# Patient Record
Sex: Male | Born: 1995 | Race: Black or African American | Hispanic: No | Marital: Single | Smoking: Never smoker
Health system: Southern US, Community
[De-identification: ages and names within clinical notes are randomized; demographics above are authoritative.]

---
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
## 2017-05-04 DIAGNOSIS — F329 Major depressive disorder, single episode, unspecified: Secondary | ICD-10-CM | POA: Diagnosis not present

## 2017-05-04 DIAGNOSIS — F411 Generalized anxiety disorder: Secondary | ICD-10-CM | POA: Diagnosis not present

## 2017-05-04 DIAGNOSIS — F451 Undifferentiated somatoform disorder: Secondary | ICD-10-CM | POA: Diagnosis not present

## 2017-05-11 DIAGNOSIS — S60221A Contusion of right hand, initial encounter: Secondary | ICD-10-CM | POA: Diagnosis not present

## 2017-05-11 DIAGNOSIS — Y9367 Activity, basketball: Secondary | ICD-10-CM | POA: Diagnosis not present

## 2017-05-27 DIAGNOSIS — F451 Undifferentiated somatoform disorder: Secondary | ICD-10-CM | POA: Diagnosis not present

## 2017-05-27 DIAGNOSIS — F411 Generalized anxiety disorder: Secondary | ICD-10-CM | POA: Diagnosis not present

## 2017-05-27 DIAGNOSIS — F329 Major depressive disorder, single episode, unspecified: Secondary | ICD-10-CM | POA: Diagnosis not present

## 2017-10-09 ENCOUNTER — Encounter: Payer: Self-pay | Admitting: Family Medicine

## 2017-10-09 ENCOUNTER — Ambulatory Visit: Payer: BLUE CROSS/BLUE SHIELD | Admitting: Family Medicine

## 2017-10-09 VITALS — BP 126/80 | HR 71 | Ht 72.0 in | Wt 310.0 lb

## 2017-10-09 DIAGNOSIS — Z6841 Body Mass Index (BMI) 40.0 and over, adult: Secondary | ICD-10-CM | POA: Insufficient documentation

## 2017-10-09 DIAGNOSIS — Z0000 Encounter for general adult medical examination without abnormal findings: Secondary | ICD-10-CM | POA: Insufficient documentation

## 2017-10-09 DIAGNOSIS — Z0001 Encounter for general adult medical examination with abnormal findings: Secondary | ICD-10-CM

## 2017-10-09 NOTE — Patient Instructions (Signed)
Exercising to Lose Weight Exercising can help you to lose weight. In order to lose weight through exercise, you need to do vigorous-intensity exercise. You can tell that you are exercising with vigorous intensity if you are breathing very hard and fast and cannot hold a conversation while exercising. Moderate-intensity exercise helps to maintain your current weight. You can tell that you are exercising at a moderate level if you have a higher heart rate and faster breathing, but you are still able to hold a conversation. How often should I exercise? Choose an activity that you enjoy and set realistic goals. Your health care provider can help you to make an activity plan that works for you. Exercise regularly as directed by your health care provider. This may include:  Doing resistance training twice each week, such as: ? Push-ups. ? Sit-ups. ? Lifting weights. ? Using resistance bands.  Doing a given intensity of exercise for a given amount of time. Choose from these options: ? 150 minutes of moderate-intensity exercise every week. ? 75 minutes of vigorous-intensity exercise every week. ? A mix of moderate-intensity and vigorous-intensity exercise every week.  Children, pregnant women, people who are out of shape, people who are overweight, and older adults may need to consult a health care provider for individual recommendations. If you have any sort of medical condition, be sure to consult your health care provider before starting a new exercise program. What are some activities that can help me to lose weight?  Walking at a rate of at least 4.5 miles an hour.  Jogging or running at a rate of 5 miles per hour.  Biking at a rate of at least 10 miles per hour.  Lap swimming.  Roller-skating or in-line skating.  Cross-country skiing.  Vigorous competitive sports, such as football, basketball, and soccer.  Jumping rope.  Aerobic dancing. How can I be more active in my day-to-day  activities?  Use the stairs instead of the elevator.  Take a walk during your lunch break.  If you drive, park your car farther away from work or school.  If you take public transportation, get off one stop early and walk the rest of the way.  Make all of your phone calls while standing up and walking around.  Get up, stretch, and walk around every 30 minutes throughout the day. What guidelines should I follow while exercising?  Do not exercise so much that you hurt yourself, feel dizzy, or get very short of breath.  Consult your health care provider prior to starting a new exercise program.  Wear comfortable clothes and shoes with good support.  Drink plenty of water while you exercise to prevent dehydration or heat stroke. Body water is lost during exercise and must be replaced.  Work out until you breathe faster and your heart beats faster. This information is not intended to replace advice given to you by your health care provider. Make sure you discuss any questions you have with your health care provider. Document Released: 03/01/2010 Document Revised: 07/05/2015 Document Reviewed: 06/30/2013 Elsevier Interactive Patient Education  2018 [place] Maintenance, Male A healthy lifestyle and preventive care is important for your health and wellness. Ask your health care provider about what schedule of regular examinations is right for you. What should I know about weight and diet? Eat a Healthy Diet  Eat plenty of vegetables, fruits, whole grains, low-fat dairy products, and lean protein.  Do not eat a lot of foods high in solid fats,  added sugars, or salt.  Maintain a Healthy Weight Regular exercise can help you achieve or maintain a healthy weight. You should:  Do at least 150 minutes of exercise each week. The exercise should increase your heart rate and make you sweat (moderate-intensity exercise).  Do strength-training exercises at least twice a  week.  Watch Your Levels of Cholesterol and Blood Lipids  Have your blood tested for lipids and cholesterol every 5 years starting at 21 years of age. If you are at high risk for heart disease, you should start having your blood tested when you are 20 years old. You may need to have your cholesterol levels checked more often if: ? Your lipid or cholesterol levels are high. ? You are older than 21 years of age. ? You are at high risk for heart disease.  What should I know about cancer screening? Many types of cancers can be detected early and may often be prevented. Lung Cancer  You should be screened every year for lung cancer if: ? You are a current smoker who has smoked for at least 30 years. ? You are a former smoker who has quit within the past 15 years.  Talk to your health care provider about your screening options, when you should start screening, and how often you should be screened.  Colorectal Cancer  Routine colorectal cancer screening usually begins at 21 years of age and should be repeated every 5-10 years until you are 21 years old. You may need to be screened more often if early forms of precancerous polyps or small growths are found. Your health care provider may recommend screening at an earlier age if you have risk factors for colon cancer.  Your health care provider may recommend using home test kits to check for hidden blood in the stool.  A small camera at the end of a tube can be used to examine your colon (sigmoidoscopy or colonoscopy). This checks for the earliest forms of colorectal cancer.  Prostate and Testicular Cancer  Depending on your age and overall health, your health care provider may do certain tests to screen for prostate and testicular cancer.  Talk to your health care provider about any symptoms or concerns you have about testicular or prostate cancer.  Skin Cancer  Check your skin from head to toe regularly.  Tell your health care provider  about any new moles or changes in moles, especially if: ? There is a change in a mole's size, shape, or color. ? You have a mole that is larger than a pencil eraser.  Always use sunscreen. Apply sunscreen liberally and repeat throughout the day.  Protect yourself by wearing long sleeves, pants, a wide-brimmed hat, and sunglasses when outside.  What should I know about heart disease, diabetes, and high blood pressure?  If you are 18-39 years of age, have your blood pressure checked every 3-5 years. If you are 40 years of age or older, have your blood pressure checked every year. You should have your blood pressure measured twice-once when you are at a hospital or clinic, and once when you are not at a hospital or clinic. Record the average of the two measurements. To check your blood pressure when you are not at a hospital or clinic, you can use: ? An automated blood pressure machine at a pharmacy. ? A home blood pressure monitor.  Talk to your health care provider about your target blood pressure.  If you are between 45-79 years old, ask   your health care provider if you should take aspirin to prevent heart disease.  Have regular diabetes screenings by checking your fasting blood sugar level. ? If you are at a normal weight and have a low risk for diabetes, have this test once every three years after the age of 76. ? If you are overweight and have a high risk for diabetes, consider being tested at a younger age or more often.  A one-time screening for abdominal aortic aneurysm (AAA) by ultrasound is recommended for men aged 65-75 years who are current or former smokers. What should I know about preventing infection? Hepatitis B If you have a higher risk for hepatitis B, you should be screened for this virus. Talk with your health care provider to find out if you are at risk for hepatitis B infection. Hepatitis C Blood testing is recommended for:  Everyone born from [birth year] through  1964.  Anyone with known risk factors for hepatitis C.  Sexually Transmitted Diseases (STDs)  You should be screened each year for STDs including gonorrhea and chlamydia if: ? You are sexually active and are younger than 21 years of age. ? You are older than 21 years of age and your health care provider tells you that you are at risk for this type of infection. ? Your sexual activity has changed since you were last screened and you are at an increased risk for chlamydia or gonorrhea. Ask your health care provider if you are at risk.  Talk with your health care provider about whether you are at high risk of being infected with HIV. Your health care provider may recommend a prescription medicine to help prevent HIV infection.  What else can I do?  Schedule regular health, dental, and eye exams.  Stay current with your vaccines (immunizations).  Do not use any tobacco products, such as cigarettes, chewing tobacco, and e-cigarettes. If you need help quitting, ask your health care provider.  Limit alcohol intake to no more than 2 drinks per day. One drink equals 12 ounces of beer, 5 ounces of wine, or 1 ounces of hard liquor.  Do not use street drugs.  Do not share needles.  Ask your health care provider for help if you need support or information about quitting drugs.  Tell your health care provider if you often feel depressed.  Tell your health care provider if you have ever been abused or do not feel safe at home. This information is not intended to replace advice given to you by your health care provider. Make sure you discuss any questions you have with your health care provider. Document Released: 07/26/2007 Document Revised: 09/26/2015 Document Reviewed: 10/31/2014 Elsevier Interactive Patient Education  2018 [employer].  How to Increase Your Level of Physical Activity Getting regular physical activity is important for your overall health and well-being. Most people do not  get enough exercise. There are easy ways to increase your level of physical activity, even if you have not been very active in the past or you are just starting out. Why is physical activity important? Physical activity has many short-term and long-term health benefits. Regular exercise can:  Help you lose weight or maintain a healthy weight.  Strengthen your muscles and bones.  Boost your mood and improve self-esteem.  Reduce your risk of certain long-term (chronic) diseases, like heart disease, cancer, and diabetes.  Help you stay capable of walking and moving around (mobile) as you age.  Prevent accidents, such as falls, as you  age.  Increase life expectancy.  What are the benefits of being physically active on a regular basis? In addition to improving your physical health, being physically active on most days of the week can help you in ways that you may not expect. Benefits of regular physical activity may include:  Feeling good about your body.  Being able to move around more easily and for longer periods of time without getting tired (increased stamina).  Finding new sources of fun and enjoyment.  Meeting new people who share a common interest.  Being able to fight off illness better (enhanced immunity).  Being able to sleep better.  What can happen if I am not physically active on a regular basis? Not getting enough physical activity can lead to an unhealthy lifestyle and future health problems. This can increase your chances of:  Becoming overweight or obese.  Becoming sick.  Developing chronic illnesses, like heart disease or diabetes.  Having mental health problems, like depression or anxiety.  Having sleep problems.  Having trouble walking or getting yourself around (reduced mobility).  Injuring yourself in a fall as you get older.  What steps can I take to be more physically active?  Check with your health care provider about how to get started. Ask  your health care provider what activities are safe for you.  Start out slowly. Walking or doing some simple chair exercises is a good place to start, especially if you have not been active before or for a long time.  Try to find activities that you enjoy. You are more likely to commit to an exercise routine if it does not feel like a chore.  If you have bone or joint problems, choose low-impact exercises, like walking or swimming.  Include physical activity in your everyday routine.  Invite friends or family members to exercise with you. This also will help you commit to your workout plan.  Set goals that you can work toward.  Aim for at least 150 minutes of moderate-intensity exercise each week. Examples of moderate-intensity exercise include walking or riding a bike. Where to find more information:  Centers for Disease Control and Prevention: JokeRule.co.ukwww.cdc.gov/physicalactivity/index.html  President's [employer] on [employer] www.http://smith-thompson.com/fitness.gov/resource-center  ChooseMyPlate: MissedFlights.com.brwww.choosemyplate.gov/physical-activity Contact a health care provider if:  You have headaches, muscle aches, or joint pain.  You feel dizzy or light-headed while exercising.  You faint.  You have chest pain while exercising. Summary  Exercise benefits your mind and body at any age, even if you are just starting out.  If you have a chronic illness or have not been active for a while, check with your health care provider before increasing your physical activity.  Choose activities that are safe and enjoyable for you.Ask your health care provider what activities are safe for you.  Start slowly. Tell your health care provider if you have problems as you start to increase your activity level. This information is not intended to replace advice given to you by your health care provider. Make sure you discuss any questions you have with your health care provider. Document Released: 01/17/2016 Document  Revised: 01/17/2016 Document Reviewed: 01/17/2016 Elsevier Interactive Patient Education  [employer]2018 Elsevier Inc.

## 2017-10-09 NOTE — Progress Notes (Signed)
Subjective:  Patient ID: Anthony Parker, male    DOB: Apr 27, 1995  Age: 22 y.o. MRN: [id number]  CC: Establish Care   HPI Miraj ROBERTS BON presents for evaluation of skin tone changes below his right eye.  He is currently at [employer] under sophomore status taking science courses.  He is not certain what he wants to do with his degree when he finishes.  He currently is not motivated to start using the gym at school.  He is entirely aware that his current weight is associated with excess consumption of calories.  He is interested in exploring some of the options that may be available for help with his weight.  No outpatient medications prior to visit.   No facility-administered medications prior to visit.     ROS Review of Systems  Constitutional: Negative for chills, diaphoresis, fatigue, fever and unexpected weight change.  HENT: Negative.   Eyes: Negative.   Respiratory: Negative.   Cardiovascular: Negative.   Gastrointestinal: Negative.   Endocrine: Negative for polyphagia and polyuria.  Genitourinary: Negative.   Skin: Positive for color change. Negative for pallor and rash.  Allergic/Immunologic: Negative for immunocompromised state.  Neurological: Negative.   Hematological: Does not bruise/bleed easily.  Psychiatric/Behavioral: Negative.     Objective:  BP 126/80   Pulse 71   Ht 6' (1.829 m)   Wt (!) 310 lb (140.6 kg)   SpO2 98%   BMI 42.04 kg/m   BP Readings from Last 3 Encounters:  10/09/17 126/80    Wt Readings from Last 3 Encounters:  10/09/17 (!) 310 lb (140.6 kg)    Physical Exam  Constitutional: He is oriented to person, place, and time. He appears well-developed and well-nourished. No distress.  HENT:  Head: Normocephalic and atraumatic.  Right Ear: External ear normal.  Left Ear: External ear normal.  Nose: Nose normal.  Mouth/Throat: Oropharynx is clear and moist. No oropharyngeal exudate.  Eyes: Pupils are equal, round, and reactive to light.  Conjunctivae and EOM are normal. Right eye exhibits no discharge. Left eye exhibits no discharge. No scleral icterus.  Neck: Normal range of motion. Neck supple. No JVD present. No tracheal deviation present. No thyromegaly present.  Cardiovascular: Normal rate, regular rhythm and normal heart sounds.  Pulmonary/Chest: Effort normal and breath sounds normal.  Abdominal: Soft. Bowel sounds are normal. He exhibits no distension. There is no tenderness. There is no guarding. Hernia confirmed negative in the right inguinal area and confirmed negative in the left inguinal area.  Genitourinary: Testes normal and penis normal. Right testis shows no mass, no swelling and no tenderness. Right testis is descended. Left testis shows no mass, no swelling and no tenderness. Left testis is descended. Circumcised. No hypospadias, penile erythema or penile tenderness. No discharge found.  Musculoskeletal: Normal range of motion. He exhibits no edema, tenderness or deformity.  Lymphadenopathy:    He has no cervical adenopathy. No inguinal adenopathy noted on the right or left side.  Neurological: He is alert and oriented to person, place, and time.  Skin: Skin is warm and dry. No rash noted. He is not diaphoretic. No erythema. No pallor.       No results found for: WBC, HGB, HCT, PLT, GLUCOSE, CHOL, TRIG, HDL, LDLDIRECT, LDLCALC, ALT, AST, NA, K, CL, CREATININE, BUN, CO2, TSH, PSA, INR, GLUF, HGBA1C, MICROALBUR  US Scrotum  Result Date: 03/07/2015 CLINICAL DATA:  Left-sided scrotal pain and swelling for 1 week EXAM: SCROTAL ULTRASOUND DOPPLER ULTRASOUND OF THE TESTICLES TECHNIQUE:  Complete ultrasound examination of the testicles, epididymis, and other scrotal structures was performed. Color and spectral Doppler ultrasound were also utilized to evaluate blood flow to the testicles. COMPARISON:  None. FINDINGS: Right testicle Measurements: 3.6 x 1.8 x 2.3 cm. No mass or microlithiasis visualized. Left testicle  Measurements: 3.0 x 1.4 x 1.9 cm. No mass or microlithiasis visualized. Right epididymis:  Normal in size and appearance. Left epididymis: The left epididymis appears inhomogeneous in echotexture and mildly hyperemic. There is no well-defined mass in the left epididymis. Hydrocele:  There are minimal hydroceles bilaterally. Varicocele:  None visualized. Pulsed Doppler interrogation of both testes demonstrates normal low resistance arterial and venous waveforms bilaterally. The peak systolic velocity in the left testis is approximately 5 cm/sec. The peak systolic velocity in the right testis is approximately 5 cm/sec. There is no scrotal abscess or scrotal wall thickening on either side. IMPRESSION: No intra testicular mass or torsion on either side. Evidence of left epididymitis. No extratesticular mass on either side. Rather minimal hydroceles bilaterally. Electronically Signed   By: Bretta BangWilliam  Woodruff III M.D.   On: 03/07/2015 15:59   [place] Art/ven Flow Abd Pelv Doppler  Result Date: 03/07/2015 CLINICAL DATA:  Left-sided scrotal pain and swelling for 1 week EXAM: SCROTAL ULTRASOUND DOPPLER ULTRASOUND OF THE TESTICLES TECHNIQUE: Complete ultrasound examination of the testicles, epididymis, and other scrotal structures was performed. Color and spectral Doppler ultrasound were also utilized to evaluate blood flow to the testicles. COMPARISON:  None. FINDINGS: Right testicle Measurements: 3.6 x 1.8 x 2.3 cm. No mass or microlithiasis visualized. Left testicle Measurements: 3.0 x 1.4 x 1.9 cm. No mass or microlithiasis visualized. Right epididymis:  Normal in size and appearance. Left epididymis: The left epididymis appears inhomogeneous in echotexture and mildly hyperemic. There is no well-defined mass in the left epididymis. Hydrocele:  There are minimal hydroceles bilaterally. Varicocele:  None visualized. Pulsed Doppler interrogation of both testes demonstrates normal low resistance arterial and venous waveforms  bilaterally. The peak systolic velocity in the left testis is approximately 5 cm/sec. The peak systolic velocity in the right testis is approximately 5 cm/sec. There is no scrotal abscess or scrotal wall thickening on either side. IMPRESSION: No intra testicular mass or torsion on either side. Evidence of left epididymitis. No extratesticular mass on either side. Rather minimal hydroceles bilaterally. Electronically Signed   By: Bretta BangWilliam  Woodruff III M.D.   On: 03/07/2015 15:59    Assessment & Plan:   Corbin was seen today for establish care.  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 not currently have medications on file.  No orders of the defined types were placed in this encounter.    Follow-up: Return in about 6 months (around 04/10/2018).  Mliss SaxWilliam Alfred Nuvia Hileman, MD

## 2017-10-16 DIAGNOSIS — F329 Major depressive disorder, single episode, unspecified: Secondary | ICD-10-CM | POA: Diagnosis not present

## 2017-10-16 DIAGNOSIS — F451 Undifferentiated somatoform disorder: Secondary | ICD-10-CM | POA: Diagnosis not present

## 2017-10-16 DIAGNOSIS — F411 Generalized anxiety disorder: Secondary | ICD-10-CM | POA: Diagnosis not present

## 2018-04-16 DIAGNOSIS — F451 Undifferentiated somatoform disorder: Secondary | ICD-10-CM | POA: Diagnosis not present

## 2018-04-16 DIAGNOSIS — F411 Generalized anxiety disorder: Secondary | ICD-10-CM | POA: Diagnosis not present

## 2018-04-16 DIAGNOSIS — F329 Major depressive disorder, single episode, unspecified: Secondary | ICD-10-CM | POA: Diagnosis not present

## 2018-05-19 DIAGNOSIS — R590 Localized enlarged lymph nodes: Secondary | ICD-10-CM | POA: Diagnosis not present

## 2018-05-28 ENCOUNTER — Ambulatory Visit: Payer: BLUE CROSS/BLUE SHIELD | Admitting: Family Medicine

## 2018-05-31 ENCOUNTER — Ambulatory Visit (INDEPENDENT_AMBULATORY_CARE_PROVIDER_SITE_OTHER): Payer: BLUE CROSS/BLUE SHIELD | Admitting: Family Medicine

## 2018-05-31 ENCOUNTER — Encounter: Payer: Self-pay | Admitting: Family Medicine

## 2018-05-31 VITALS — Ht 72.0 in

## 2018-05-31 DIAGNOSIS — R0981 Nasal congestion: Secondary | ICD-10-CM

## 2018-05-31 DIAGNOSIS — R221 Localized swelling, mass and lump, neck: Secondary | ICD-10-CM

## 2018-05-31 DIAGNOSIS — Z0001 Encounter for general adult medical examination with abnormal findings: Secondary | ICD-10-CM

## 2018-05-31 DIAGNOSIS — R590 Localized enlarged lymph nodes: Secondary | ICD-10-CM | POA: Diagnosis not present

## 2018-05-31 MED ORDER — FLUTICASONE PROPIONATE 50 MCG/ACT NA SUSP: 2.0000 | Freq: Every day | NASAL | 6 refills | Status: DC

## 2018-05-31 NOTE — Progress Notes (Signed)
Established Patient Office Visit  Subjective:  Patient ID: Anthony Parker, male    DOB: 11/28/1995  Age: 23 y.o. MRN: [id number]  CC:  Chief Complaint  Patient presents with  . Adenopathy    HPI Anthony Parker presents for evaluation of a longstanding history neck masses that have been described as enlarged lymph nodes.  There is a large 1 in the left anterior neck down at the base of the neck.  Patient says that this is been present for over a year now.  More recently he has had the development of smaller masses also on the left side of his neck.  Patient denies fevers chills, night sweats or weight loss.  He has had some problems with his teeth including a dental carry that is new.  Denies swelling or pain in his teeth.  Teeth that have previously been filled have been a little cold sensitive.  Denies postnasal drip facial pressure rhinorrhea but does admit to chronic nasal congestion.  He says that he uses a nasal decongestants perhaps 4 times a month.  He was seen at urgent care last week for this and referred back to me.  A CBC was drawn at that time and he was told that his white blood cell count was low.  History reviewed. No pertinent past medical history.  History reviewed. No pertinent surgical history.  History reviewed. No pertinent family history.  Social History   Socioeconomic History  . Marital status: Single    Spouse name: Not on file  . Number of children: Not on file  . Years of education: Not on file  . Highest education level: Not on file  Occupational History  . Not on file  Social Needs  . Financial resource strain: Not on file  . Food insecurity:    Worry: Not on file    Inability: Not on file  . Transportation needs:    Medical: Not on file    Non-medical: Not on file  Tobacco Use  . Smoking status: Never Smoker  . Smokeless tobacco: Never Used  Substance and Sexual Activity  . Alcohol use: Not on file  . Drug use: Not on file  . Sexual  activity: Not on file  Lifestyle  . Physical activity:    Days per week: Not on file    Minutes per session: Not on file  . Stress: Not on file  Relationships  . Social connections:    Talks on phone: Not on file    Gets together: Not on file    Attends religious service: Not on file    Active member of club or organization: Not on file    Attends meetings of clubs or organizations: Not on file    Relationship status: Not on file  . Intimate partner violence:    Fear of current or ex partner: Not on file    Emotionally abused: Not on file    Physically abused: Not on file    Forced sexual activity: Not on file  Other Topics Concern  . Not on file  Social History Narrative  . Not on file    Outpatient Medications Prior to Visit  Medication Sig Dispense Refill  . sertraline (ZOLOFT) 50 MG tablet Take 1 tablet by mouth daily.    Marland Kitchen topiramate (TOPAMAX) 25 MG tablet      No facility-administered medications prior to visit.     Not on File  ROS Review of Systems  Constitutional: Negative  for activity change, appetite change, chills, diaphoresis, fatigue, fever and unexpected weight change.  HENT: Positive for congestion, dental problem and sore throat. Negative for hearing loss, mouth sores, nosebleeds, postnasal drip, rhinorrhea, sinus pain, trouble swallowing and voice change.   Respiratory: Negative.   Cardiovascular: Negative.   Gastrointestinal: Negative.   Genitourinary: Negative.   Musculoskeletal: Negative for arthralgias, myalgias, neck pain and neck stiffness.  Skin: Negative for pallor and rash.  Allergic/Immunologic: Negative for immunocompromised state.  Neurological: Negative for speech difficulty, weakness and headaches.  Hematological: Does not bruise/bleed easily.      Objective:    Physical Exam  Constitutional: He is oriented to person, place, and time. He appears well-developed and well-nourished. No distress.  HENT:  Head: Normocephalic and  atraumatic.  Right Ear: External ear normal.  Left Ear: External ear normal.  Eyes: Right eye exhibits no discharge. Left eye exhibits no discharge. No scleral icterus.  Pulmonary/Chest: Effort normal.  Neurological: He is alert and oriented to person, place, and time.  Skin: He is not diaphoretic.  Psychiatric: He has a normal mood and affect. His behavior is normal.    Ht 6' (1.829 m)   BMI 42.04 kg/m  Wt Readings from Last 3 Encounters:  10/09/17 (!) 310 lb (140.6 kg)     Health Maintenance Due  Topic Date Due  . HIV Screening  08/05/2011  . TETANUS/TDAP  08/05/2015    There are no preventive care reminders to display for this patient.  No results found for: TSH No results found for: WBC, HGB, HCT, MCV, PLT No results found for: NA, K, CHLORIDE, CO2, GLUCOSE, BUN, CREATININE, BILITOT, ALKPHOS, AST, ALT, PROT, ALBUMIN, CALCIUM, ANIONGAP, EGFR, GFR No results found for: CHOL No results found for: HDL No results found for: LDLCALC No results found for: TRIG No results found for: CHOLHDL No results found for: HGBA1C    Assessment & Plan:   Problem List Items Addressed This Visit      Immune and Lymphatic   Cervical adenopathy   Relevant Orders   CBC   HIV Antibody (routine testing w rflx)   RPR   Sedimentation rate     Other   Encounter for health maintenance examination with abnormal findings   Relevant Orders   CBC   Comprehensive metabolic panel   Lipid panel   TSH   Urinalysis, Routine w reflex microscopic   Neck mass - Primary   Relevant Orders   CBC   TSH   Sedimentation rate   Nasal congestion   Relevant Medications   fluticasone (FLONASE) 50 MCG/ACT nasal spray      Meds ordered this encounter  Medications  . fluticasone (FLONASE) 50 MCG/ACT nasal spray    Sig: Place 2 sprays into both nostrils daily.    Dispense:  16 g    Refill:  6    Follow-up: No follow-ups on file.    Libby Maw, MDVirtual Visit via Video Note  I  connected with Anthony Parker on 05/31/18 at  3:30 PM EDT by a video enabled telemedicine application and verified that I am speaking with the correct person using two identifiers.   I discussed the limitations of evaluation and management by telemedicine and the availability of in person appointments. The patient expressed understanding and agreed to proceed.  History of Present Illness:    Observations/Objective:   Assessment and Plan:   Follow Up Instructions:    I discussed the assessment and treatment plan with  the patient. The patient was provided an opportunity to ask questions and all were answered. The patient agreed with the plan and demonstrated an understanding of the instructions.   The patient was advised to call back or seek an in-person evaluation if the symptoms worsen or if the condition fails to improve as anticipated.  I provided 30 minutes of non-face-to-face time during this encounter.  Patient will schedule a lab visit and have those labs drawn.  He will then be scheduled to see me in person for evaluation of his neck masses.  Patient was asked to discontinue the nasal decongestant and start Flonase.  We will check his progress when I see him hopefully in the next few weeks.

## 2018-06-02 ENCOUNTER — Other Ambulatory Visit (INDEPENDENT_AMBULATORY_CARE_PROVIDER_SITE_OTHER): Payer: BLUE CROSS/BLUE SHIELD

## 2018-06-02 DIAGNOSIS — R590 Localized enlarged lymph nodes: Secondary | ICD-10-CM

## 2018-06-02 DIAGNOSIS — Z0001 Encounter for general adult medical examination with abnormal findings: Secondary | ICD-10-CM | POA: Diagnosis not present

## 2018-06-02 DIAGNOSIS — R221 Localized swelling, mass and lump, neck: Secondary | ICD-10-CM

## 2018-06-02 LAB — COMPREHENSIVE METABOLIC PANEL
ALT: 26 U/L (ref 0–53)
AST: 21 U/L (ref 0–37)
Albumin: 3.9 g/dL (ref 3.5–5.2)
Alkaline Phosphatase: 95 U/L (ref 39–117)
BUN: 14 mg/dL (ref 6–23)
CO2: 26 mEq/L (ref 19–32)
Calcium: 9.5 mg/dL (ref 8.4–10.5)
Chloride: 103 mEq/L (ref 96–112)
Creatinine, Ser: 0.8 mg/dL (ref 0.40–1.50)
GFR: 146.5 mL/min (ref >60.00)
Glucose, Bld: 136 mg/dL — ABNORMAL HIGH (ref 70–99)
Potassium: 4.4 mEq/L (ref 3.5–5.1)
Sodium: 138 mEq/L (ref 135–145)
Total Bilirubin: 0.4 mg/dL (ref 0.2–1.2)
Total Protein: 7.6 g/dL (ref 6.0–8.3)

## 2018-06-02 LAB — LIPID PANEL
Cholesterol: 189 mg/dL (ref 0–200)
HDL: 46.9 mg/dL (ref >39.00)
LDL Cholesterol: 129 mg/dL — ABNORMAL HIGH (ref 0–99)
NonHDL: 141.6
Total CHOL/HDL Ratio: 4
Triglycerides: 62 mg/dL (ref 0.0–149.0)
VLDL: 12.4 mg/dL (ref 0.0–40.0)

## 2018-06-02 LAB — URINALYSIS, ROUTINE W REFLEX MICROSCOPIC
Bilirubin Urine: NEGATIVE
Hgb urine dipstick: NEGATIVE
Ketones, ur: NEGATIVE
Leukocytes,Ua: NEGATIVE
Nitrite: NEGATIVE
RBC / HPF: NONE SEEN (ref 0–?)
Specific Gravity, Urine: >=1.03 — AB (ref 1.000–1.030)
Total Protein, Urine: NEGATIVE
Urine Glucose: NEGATIVE
Urobilinogen, UA: 0.2 (ref 0.0–1.0)
pH: 5.5 (ref 5.0–8.0)

## 2018-06-02 LAB — CBC
HCT: 40 % (ref 39.0–52.0)
Hemoglobin: 13.2 g/dL (ref 13.0–17.0)
MCHC: 32.9 g/dL (ref 30.0–36.0)
MCV: 85.4 fl (ref 78.0–100.0)
Platelets: 265 10*3/uL (ref 150.0–400.0)
RBC: 4.68 Mil/uL (ref 4.22–5.81)
RDW: 12.5 % (ref 11.5–15.5)
WBC: 7.4 10*3/uL (ref 4.0–10.5)

## 2018-06-02 LAB — SEDIMENTATION RATE: Sed Rate: 77 mm/hr — ABNORMAL HIGH (ref 0–15)

## 2018-06-02 LAB — TSH: TSH: 5.21 u[IU]/mL — ABNORMAL HIGH (ref 0.35–4.50)

## 2018-06-03 LAB — HIV ANTIBODY (ROUTINE TESTING W REFLEX): HIV 1&2 Ab, 4th Generation: NONREACTIVE

## 2018-06-03 LAB — RPR: RPR Ser Ql: NONREACTIVE

## 2018-06-08 ENCOUNTER — Encounter: Payer: Self-pay | Admitting: Family Medicine

## 2018-06-08 ENCOUNTER — Ambulatory Visit (INDEPENDENT_AMBULATORY_CARE_PROVIDER_SITE_OTHER): Payer: BLUE CROSS/BLUE SHIELD

## 2018-06-08 ENCOUNTER — Other Ambulatory Visit: Payer: Self-pay

## 2018-06-08 ENCOUNTER — Ambulatory Visit: Payer: BLUE CROSS/BLUE SHIELD | Admitting: Family Medicine

## 2018-06-08 VITALS — BP 124/80 | HR 97 | Temp 98.0°F | Ht 72.0 in | Wt 384.0 lb

## 2018-06-08 DIAGNOSIS — R079 Chest pain, unspecified: Secondary | ICD-10-CM

## 2018-06-08 DIAGNOSIS — R221 Localized swelling, mass and lump, neck: Secondary | ICD-10-CM

## 2018-06-08 DIAGNOSIS — R0982 Postnasal drip: Secondary | ICD-10-CM | POA: Diagnosis not present

## 2018-06-08 DIAGNOSIS — R590 Localized enlarged lymph nodes: Secondary | ICD-10-CM

## 2018-06-08 DIAGNOSIS — R7989 Other specified abnormal findings of blood chemistry: Secondary | ICD-10-CM | POA: Diagnosis not present

## 2018-06-08 DIAGNOSIS — R700 Elevated erythrocyte sedimentation rate: Secondary | ICD-10-CM | POA: Diagnosis not present

## 2018-06-08 DIAGNOSIS — R7309 Other abnormal glucose: Secondary | ICD-10-CM | POA: Diagnosis not present

## 2018-06-08 DIAGNOSIS — R0602 Shortness of breath: Secondary | ICD-10-CM | POA: Diagnosis not present

## 2018-06-08 LAB — CBC
HCT: 41.5 % (ref 39.0–52.0)
Hemoglobin: 13.8 g/dL (ref 13.0–17.0)
MCHC: 33.3 g/dL (ref 30.0–36.0)
MCV: 85.7 fl (ref 78.0–100.0)
Platelets: 277 10*3/uL (ref 150.0–400.0)
RBC: 4.85 Mil/uL (ref 4.22–5.81)
RDW: 12.8 % (ref 11.5–15.5)
WBC: 8.2 10*3/uL (ref 4.0–10.5)

## 2018-06-08 LAB — HEMOGLOBIN A1C: Hgb A1c MFr Bld: 5.5 % (ref 4.6–6.5)

## 2018-06-08 LAB — SEDIMENTATION RATE: Sed Rate: 90 mm/hr — ABNORMAL HIGH (ref 0–15)

## 2018-06-08 LAB — C-REACTIVE PROTEIN: CRP: 2.4 mg/dL (ref 0.5–20.0)

## 2018-06-08 NOTE — Progress Notes (Signed)
Established Patient Office Visit  Subjective:  Patient ID: Anthony Parker, male    DOB: 07/17/1995  Age: 23 y.o. MRN: [id number]  CC:  Chief Complaint  Patient presents with  . Adenopathy    HPI [name] presents for follow-up of his bilateral neck masses.  Recent blood work did show a normal CBC.  However there was elevation of the TSH and his sedimentation rate.  Patient denies palpitations, weight loss or night sweats.  He feels relatively healthy.  He has been experiencing some mild chest pain.  Denies exertional component shortness of breath dyspnea on exertion nausea or vomiting or diaphoresis.  His father is currently on life support and there is plan to discontinue that today.  It is believed he had suffered a massive heart attack and has not been able to recover.  Patient has a history of anxiety depression.  He is currently out of work due to [employer].  History reviewed. No pertinent past medical history.  History reviewed. No pertinent surgical history.  History reviewed. No pertinent family history.  Social History   Socioeconomic History  . Marital status: Single    Spouse name: Not on file  . Number of children: Not on file  . Years of education: Not on file  . Highest education level: Not on file  Occupational History  . Not on file  Social Needs  . Financial resource strain: Not on file  . Food insecurity:    Worry: Not on file    Inability: Not on file  . Transportation needs:    Medical: Not on file    Non-medical: Not on file  Tobacco Use  . Smoking status: Never Smoker  . Smokeless tobacco: Never Used  Substance and Sexual Activity  . Alcohol use: Not on file  . Drug use: Not on file  . Sexual activity: Not on file  Lifestyle  . Physical activity:    Days per week: Not on file    Minutes per session: Not on file  . Stress: Not on file  Relationships  . Social connections:    Talks on phone: Not on file    Gets together: Not on file   Attends religious service: Not on file    Active member of club or organization: Not on file    Attends meetings of clubs or organizations: Not on file    Relationship status: Not on file  . Intimate partner violence:    Fear of current or ex partner: Not on file    Emotionally abused: Not on file    Physically abused: Not on file    Forced sexual activity: Not on file  Other Topics Concern  . Not on file  Social History Narrative  . Not on file    Outpatient Medications Prior to Visit  Medication Sig Dispense Refill  . fluticasone (FLONASE) 50 MCG/ACT nasal spray Place 2 sprays into both nostrils daily. 16 g 6  . sertraline (ZOLOFT) 50 MG tablet Take 1 tablet by mouth daily.    Marland Kitchen topiramate (TOPAMAX) 25 MG tablet      No facility-administered medications prior to visit.     Not on File  ROS Review of Systems  Constitutional: Negative for chills, diaphoresis, fatigue, fever and unexpected weight change.  HENT: Positive for postnasal drip and sneezing. Negative for dental problem, rhinorrhea, sinus pressure, sinus pain, sore throat, trouble swallowing and voice change.   Eyes: Negative for photophobia and visual disturbance.  Respiratory: Negative for chest tightness, shortness of breath and wheezing.   Cardiovascular: Positive for chest pain. Negative for palpitations and leg swelling.  Gastrointestinal: Negative.   Endocrine: Negative for polyphagia and polyuria.  Genitourinary: Negative.   Musculoskeletal: Negative for gait problem and joint swelling.  Skin: Negative for pallor and rash.  Allergic/Immunologic: Negative for immunocompromised state.  Neurological: Negative for light-headedness, numbness and headaches.  Hematological: Positive for adenopathy. Does not bruise/bleed easily.  Psychiatric/Behavioral: The patient is nervous/anxious.       Objective:    Physical Exam  Constitutional: He is oriented to person, place, and time. He appears well-developed and  well-nourished. No distress.  HENT:  Head: Normocephalic and atraumatic.  Right Ear: External ear normal.  Left Ear: External ear normal.  Mouth/Throat: Oropharynx is clear and moist. No oropharyngeal exudate.  Eyes: Pupils are equal, round, and reactive to light. Conjunctivae are normal. Right eye exhibits no discharge. Left eye exhibits no discharge. No scleral icterus.  Neck: Neck supple. No JVD present. No tracheal deviation present.  Cardiovascular: Normal rate, regular rhythm and normal heart sounds.  Pulmonary/Chest: Effort normal and breath sounds normal. No stridor.  Abdominal: Soft. Bowel sounds are normal. He exhibits no distension. There is no abdominal tenderness. There is no rebound and no guarding. Hernia confirmed negative in the right inguinal area and confirmed negative in the left inguinal area.  Genitourinary: Right testis shows no mass, no swelling and no tenderness. Right testis is descended. Cremasteric reflex is not absent on the right side. Left testis shows no swelling and no tenderness. Left testis is descended. Cremasteric reflex is not absent on the left side. Circumcised. No hypospadias, penile erythema or penile tenderness. No discharge found.  Lymphadenopathy:    He has cervical adenopathy.       Right cervical: Posterior cervical adenopathy present.       Left cervical: Posterior cervical adenopathy present.       Right axillary: No pectoral and no lateral adenopathy present.       Left axillary: No pectoral and no lateral adenopathy present.      Right: No inguinal adenopathy present.       Left: No inguinal adenopathy present.  1 cm nodes noted posterior cervical area.   Neurological: He is alert and oriented to person, place, and time.  Skin: Skin is warm and dry. He is not diaphoretic.  Psychiatric: He has a normal mood and affect. His behavior is normal.    BP 124/80   Pulse 97   Temp 98 F (36.7 C) (Oral)   Ht 6' (1.829 m)   Wt (!) 384 lb (174.2  kg)   SpO2 99%   BMI 52.08 kg/m  Wt Readings from Last 3 Encounters:  06/08/18 (!) 384 lb (174.2 kg)  10/09/17 (!) 310 lb (140.6 kg)   BP Readings from Last 3 Encounters:  06/08/18 124/80  10/09/17 126/80   Guideline developer:  UpToDate (see UpToDate for funding source) Date Released: June 2014  Health Maintenance Due  Topic Date Due  . Janet Berlin  08/05/2015    There are no preventive care reminders to display for this patient.  Lab Results  Component Value Date   TSH 5.21 (H) 06/02/2018   Lab Results  Component Value Date   WBC 7.4 06/02/2018   HGB 13.2 06/02/2018   HCT 40.0 06/02/2018   MCV 85.4 06/02/2018   PLT 265.0 06/02/2018   Lab Results  Component Value Date   NA 138  06/02/2018   K 4.4 06/02/2018   CO2 26 06/02/2018   GLUCOSE 136 (H) 06/02/2018   BUN 14 06/02/2018   CREATININE 0.80 06/02/2018   BILITOT 0.4 06/02/2018   ALKPHOS 95 06/02/2018   AST 21 06/02/2018   ALT 26 06/02/2018   PROT 7.6 06/02/2018   ALBUMIN 3.9 06/02/2018   CALCIUM 9.5 06/02/2018   GFR 146.50 06/02/2018   Lab Results  Component Value Date   CHOL 189 06/02/2018   Lab Results  Component Value Date   HDL 46.90 06/02/2018   Lab Results  Component Value Date   LDLCALC 129 (H) 06/02/2018   Lab Results  Component Value Date   TRIG 62.0 06/02/2018   Lab Results  Component Value Date   CHOLHDL 4 06/02/2018   No results found for: HGBA1C    Assessment & Plan:   Problem List Items Addressed This Visit      Immune and Lymphatic   Cervical adenopathy - Primary   Relevant Orders   CBC   US Soft Tissue Head/Neck     Other   Neck mass   Post-nasal drip   Elevated TSH   Chest pain   Relevant Orders   Thyroid Panel With TSH   DG Chest 2 View   Elevated sed rate   Relevant Orders   C-reactive protein   Sedimentation rate   Elevated glucose   Relevant Orders   Hemoglobin A1c      No orders of the defined types were placed in this encounter.    Follow-up: Return in about 1 month (around 07/08/2018).   Return in 1 month.  Discussed the possibility of a possible lymph node biopsy pending results of today's work-up.  Follow-up in 1 month.

## 2018-06-08 NOTE — Addendum Note (Signed)
Addended by: Andrez Grime on: 06/08/2018 03:04 PM   Modules accepted: Orders

## 2018-06-09 LAB — THYROID PANEL WITH TSH
Free Thyroxine Index: 2.7 (ref 1.4–3.8)
T3 Uptake: 31 % (ref 22–35)
T4, Total: 8.6 ug/dL (ref 4.9–10.5)
TSH: 4.27 mIU/L (ref 0.40–4.50)

## 2018-06-09 LAB — ANA: Anti Nuclear Antibody (ANA): NEGATIVE

## 2018-06-15 ENCOUNTER — Encounter: Payer: Self-pay | Admitting: Family Medicine

## 2018-06-24 ENCOUNTER — Encounter: Payer: Self-pay | Admitting: Family Medicine

## 2018-06-24 ENCOUNTER — Ambulatory Visit (INDEPENDENT_AMBULATORY_CARE_PROVIDER_SITE_OTHER): Payer: BLUE CROSS/BLUE SHIELD | Admitting: Family Medicine

## 2018-06-24 VITALS — Ht 72.0 in

## 2018-06-24 DIAGNOSIS — R700 Elevated erythrocyte sedimentation rate: Secondary | ICD-10-CM

## 2018-06-24 DIAGNOSIS — R635 Abnormal weight gain: Secondary | ICD-10-CM | POA: Diagnosis not present

## 2018-06-24 DIAGNOSIS — R1030 Lower abdominal pain, unspecified: Secondary | ICD-10-CM | POA: Diagnosis not present

## 2018-06-24 DIAGNOSIS — R590 Localized enlarged lymph nodes: Secondary | ICD-10-CM | POA: Diagnosis not present

## 2018-06-24 MED ORDER — DEXAMETHASONE 1 MG PO TABS: ORAL_TABLET | ORAL | 0 refills | Status: DC

## 2018-06-24 NOTE — Progress Notes (Addendum)
Established Patient Office Visit  Subjective:  Patient ID: Anthony Parker, male    DOB: 22-Dec-1995  Age: 23 y.o. MRN: [id number]  CC:  Chief Complaint  Patient presents with  . Follow-up    lab results    HPI Anthony Parker presents for follow-up of his elevating ESR with firm, fixed and gradually enlarging cervical lymphadenopathy.  Patient has been suffering from fatigue but has been gaining weight(74 lbs since August).  Denies night sweats myalgias or arthralgias.  Testing for HIV thyroid and CBC ANA  CRP   and hemoglobin A1c have all been normal.  See also review of symptoms documented today. History reviewed. No pertinent past medical history.  History reviewed. No pertinent surgical history.  History reviewed. No pertinent family history.  Social History   Socioeconomic History  . Marital status: Single    Spouse name: Not on file  . Number of children: Not on file  . Years of education: Not on file  . Highest education level: Not on file  Occupational History  . Not on file  Social Needs  . Financial resource strain: Not on file  . Food insecurity:    Worry: Not on file    Inability: Not on file  . Transportation needs:    Medical: Not on file    Non-medical: Not on file  Tobacco Use  . Smoking status: Never Smoker  . Smokeless tobacco: Never Used  Substance and Sexual Activity  . Alcohol use: Not on file  . Drug use: Not on file  . Sexual activity: Not on file  Lifestyle  . Physical activity:    Days per week: Not on file    Minutes per session: Not on file  . Stress: Not on file  Relationships  . Social connections:    Talks on phone: Not on file    Gets together: Not on file    Attends religious service: Not on file    Active member of club or organization: Not on file    Attends meetings of clubs or organizations: Not on file    Relationship status: Not on file  . Intimate partner violence:    Fear of current or ex partner: Not on file   Emotionally abused: Not on file    Physically abused: Not on file    Forced sexual activity: Not on file  Other Topics Concern  . Not on file  Social History Narrative  . Not on file    Outpatient Medications Prior to Visit  Medication Sig Dispense Refill  . fluticasone (FLONASE) 50 MCG/ACT nasal spray Place 2 sprays into both nostrils daily. 16 g 6  . sertraline (ZOLOFT) 50 MG tablet Take 1 tablet by mouth daily.    Marland Kitchen topiramate (TOPAMAX) 25 MG tablet      No facility-administered medications prior to visit.     Not on File  ROS Review of Systems  Constitutional: Positive for fatigue and unexpected weight change (weight gain). Negative for chills, diaphoresis and fever.  HENT: Positive for rhinorrhea. Negative for dental problem, sinus pressure, sinus pain and sore throat.   Eyes: Negative for photophobia and visual disturbance.  Respiratory: Negative for cough, shortness of breath and wheezing.   Cardiovascular: Positive for chest pain.  Gastrointestinal: Positive for abdominal pain. Negative for blood in stool, constipation, nausea and vomiting.  Genitourinary: Negative for difficulty urinating and hematuria.  Musculoskeletal: Negative for gait problem and joint swelling.  Allergic/Immunologic: Negative for immunocompromised state.  Neurological: Positive for headaches (has migraines that are rare).  Hematological: Does not bruise/bleed easily.  Psychiatric/Behavioral: Negative.       Objective:    Physical Exam  Constitutional: He is oriented to person, place, and time. He appears well-developed and well-nourished. No distress.  Eyes: Right eye exhibits no discharge. Left eye exhibits no discharge. No scleral icterus.  Pulmonary/Chest: Effort normal.  Neurological: He is alert and oriented to person, place, and time.  Skin: Skin is warm and dry. He is not diaphoretic.  Psychiatric: He has a normal mood and affect. His behavior is normal.    Ht 6' (1.829 m)   BMI  52.08 kg/m  Wt Readings from Last 3 Encounters:  06/08/18 (!) 384 lb (174.2 kg)  10/09/17 (!) 310 lb (140.6 kg)     Health Maintenance Due  Topic Date Due  . TETANUS/TDAP  08/05/2015    There are no preventive care reminders to display for this patient.  Lab Results  Component Value Date   TSH 4.27 06/08/2018   Lab Results  Component Value Date   WBC 8.2 06/08/2018   HGB 13.8 06/08/2018   HCT 41.5 06/08/2018   MCV 85.7 06/08/2018   PLT 277.0 06/08/2018   Lab Results  Component Value Date   NA 138 06/02/2018   K 4.4 06/02/2018   CO2 26 06/02/2018   GLUCOSE 136 (H) 06/02/2018   BUN 14 06/02/2018   CREATININE 0.80 06/02/2018   BILITOT 0.4 06/02/2018   ALKPHOS 95 06/02/2018   AST 21 06/02/2018   ALT 26 06/02/2018   PROT 7.6 06/02/2018   ALBUMIN 3.9 06/02/2018   CALCIUM 9.5 06/02/2018   GFR 146.50 06/02/2018   Lab Results  Component Value Date   CHOL 189 06/02/2018   Lab Results  Component Value Date   HDL 46.90 06/02/2018   Lab Results  Component Value Date   LDLCALC 129 (H) 06/02/2018   Lab Results  Component Value Date   TRIG 62.0 06/02/2018   Lab Results  Component Value Date   CHOLHDL 4 06/02/2018   Lab Results  Component Value Date   HGBA1C 5.5 06/08/2018      Assessment & Plan:   Problem List Items Addressed This Visit      Immune and Lymphatic   Cervical adenopathy - Primary   Relevant Orders   Ambulatory referral to ENT     Other   Elevated sed rate   Relevant Orders   Ambulatory referral to ENT   Weight gain   Relevant Medications   dexamethasone (DECADRON) 1 MG tablet   Other Relevant Orders   Cortisol (Completed)   Lower abdominal pain   Relevant Orders   CT Abdomen Pelvis W Contrast      Meds ordered this encounter  Medications  . dexamethasone (DECADRON) 1 MG tablet    Sig: Please take tablet between 11PM and 12PM and report to our lab the next morning at 8 for scheduled labs.    Dispense:  1 tablet    Refill:   0    Follow-up: Return in about 1 month (around 07/25/2018).    Libby Maw, MDVirtual Visit via Video Note  I connected with Anthony Parker on 07/15/18 at 10:30 AM EDT by a video enabled telemedicine application and verified that I am speaking with the correct person using two identifiers.  Location: Patient: home Provider:   I discussed the limitations of evaluation and management by telemedicine and the availability of  in person appointments. The patient expressed understanding and agreed to proceed.  History of Present Illness:    Observations/Objective:   Assessment and Plan:   Follow Up Instructions:    I discussed the assessment and treatment plan with the patient. The patient was provided an opportunity to ask questions and all were answered. The patient agreed with the plan and demonstrated an understanding of the instructions.   The patient was advised to call back or seek an in-person evaluation if the symptoms worsen or if the condition fails to improve as anticipated.  I provided 20 minutes of non-face-to-face time during this encounter.

## 2018-07-06 DIAGNOSIS — R700 Elevated erythrocyte sedimentation rate: Secondary | ICD-10-CM | POA: Diagnosis not present

## 2018-07-06 DIAGNOSIS — Z6841 Body Mass Index (BMI) 40.0 and over, adult: Secondary | ICD-10-CM | POA: Diagnosis not present

## 2018-07-06 DIAGNOSIS — R590 Localized enlarged lymph nodes: Secondary | ICD-10-CM | POA: Diagnosis not present

## 2018-07-11 ENCOUNTER — Encounter: Payer: Self-pay | Admitting: Family Medicine

## 2018-07-14 ENCOUNTER — Other Ambulatory Visit (INDEPENDENT_AMBULATORY_CARE_PROVIDER_SITE_OTHER): Payer: BC Managed Care – PPO

## 2018-07-14 ENCOUNTER — Other Ambulatory Visit: Payer: BLUE CROSS/BLUE SHIELD

## 2018-07-14 DIAGNOSIS — R635 Abnormal weight gain: Secondary | ICD-10-CM

## 2018-07-14 LAB — CORTISOL: Cortisol, Plasma: 0.8 ug/dL

## 2018-07-15 ENCOUNTER — Ambulatory Visit
Admission: RE | Admit: 2018-07-15 | Discharge: 2018-07-15 | Disposition: A | Discharge status: Home / Self Care | Payer: BLUE CROSS/BLUE SHIELD | Source: Ambulatory Visit | Attending: Family Medicine | Admitting: Family Medicine

## 2018-07-15 DIAGNOSIS — R590 Localized enlarged lymph nodes: Secondary | ICD-10-CM

## 2018-07-15 DIAGNOSIS — R591 Generalized enlarged lymph nodes: Secondary | ICD-10-CM | POA: Diagnosis not present

## 2018-07-15 DIAGNOSIS — R1030 Lower abdominal pain, unspecified: Secondary | ICD-10-CM | POA: Insufficient documentation

## 2018-07-15 NOTE — Addendum Note (Signed)
Addended by: Andrez Grime on: 07/15/2018 08:25 AM   Modules accepted: Orders

## 2018-07-29 DIAGNOSIS — F451 Undifferentiated somatoform disorder: Secondary | ICD-10-CM | POA: Diagnosis not present

## 2018-07-29 DIAGNOSIS — F329 Major depressive disorder, single episode, unspecified: Secondary | ICD-10-CM | POA: Diagnosis not present

## 2018-07-29 DIAGNOSIS — F411 Generalized anxiety disorder: Secondary | ICD-10-CM | POA: Diagnosis not present

## 2018-08-09 DIAGNOSIS — R590 Localized enlarged lymph nodes: Secondary | ICD-10-CM | POA: Diagnosis not present

## 2018-08-09 DIAGNOSIS — R700 Elevated erythrocyte sedimentation rate: Secondary | ICD-10-CM | POA: Diagnosis not present

## 2018-08-27 ENCOUNTER — Encounter: Payer: Self-pay | Admitting: Family Medicine

## 2019-06-09 ENCOUNTER — Encounter: Payer: Self-pay | Admitting: Family Medicine

## 2019-06-10 NOTE — Telephone Encounter (Signed)
This is a chronic problem for him. Lorazepam is for short term use only. I would not be comfortable prescribing it for him. He should stick with psychiatry.

## 2019-07-02 ENCOUNTER — Encounter: Payer: Self-pay | Admitting: Family Medicine

## 2019-07-05 ENCOUNTER — Telehealth: Payer: Self-pay | Admitting: Family Medicine

## 2019-07-05 NOTE — Telephone Encounter (Signed)
Received mychart request from patient to schedule with Dr. Doreene Burke for tremors and headaches, per Dr. Evangeline Gula request. I reached out to patient and left a voicemail for him to call the office to schedule an appt.

## 2019-11-04 ENCOUNTER — Ambulatory Visit: Payer: Self-pay | Admitting: Family Medicine

## 2019-11-04 DIAGNOSIS — Z0289 Encounter for other administrative examinations: Secondary | ICD-10-CM

## 2019-11-21 ENCOUNTER — Encounter: Payer: Self-pay | Admitting: Family Medicine

## 2019-11-21 ENCOUNTER — Telehealth: Payer: Self-pay | Admitting: Family Medicine

## 2019-11-21 NOTE — Telephone Encounter (Signed)
Pt was no show for appt 11/04/2019 for acute visit. First occurrence. Fee waived. Letter mailed.

## 2019-12-11 IMAGING — DX CHEST - 2 VIEW
3 series · 3 of 3 positions shown · non-contrast
Comparison: None.

CLINICAL DATA: Chest pain.  Shortness of breath.

EXAM:
CHEST - 2 VIEW

[chest pa (1 of 2)]
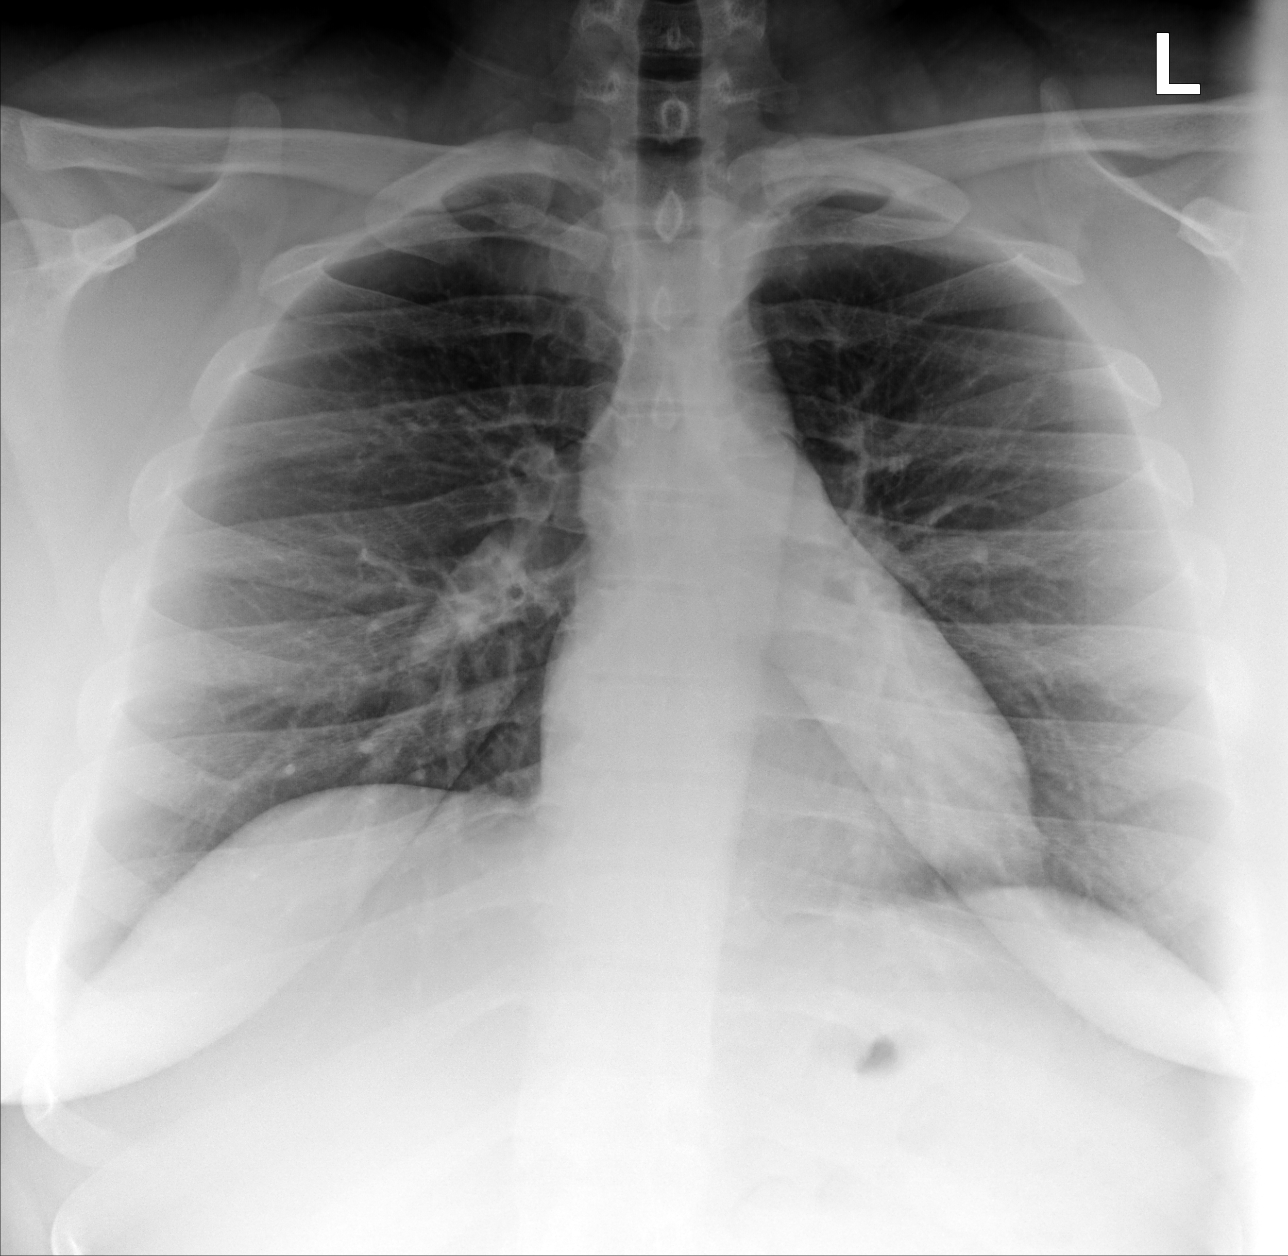

[chest pa (2 of 2)]
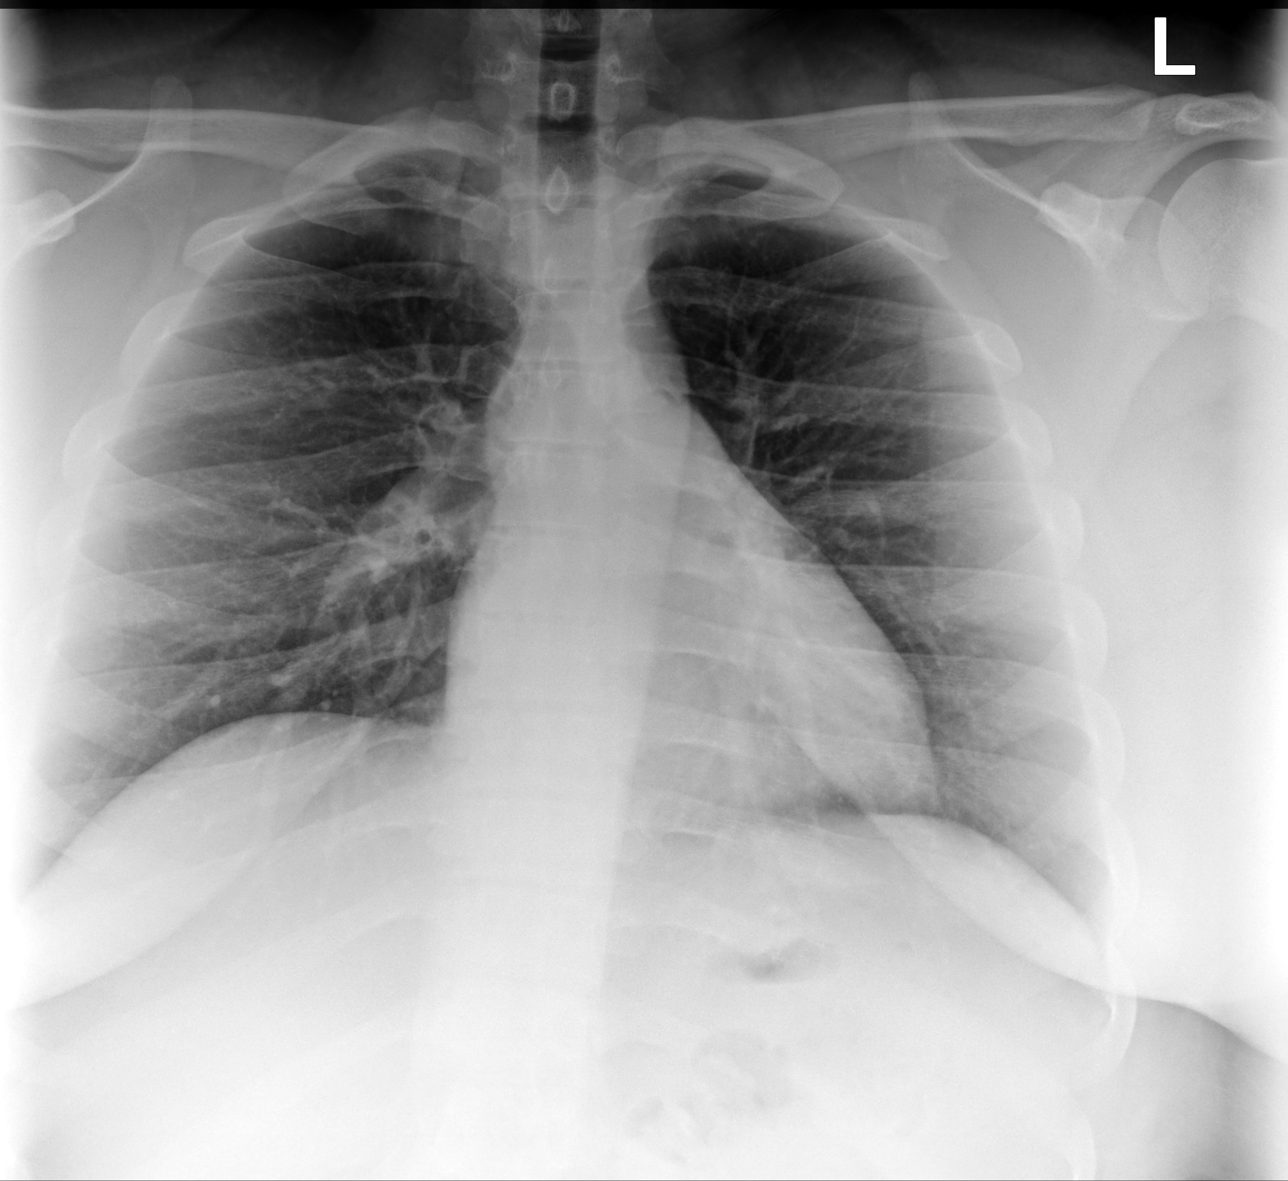

[chest lat]
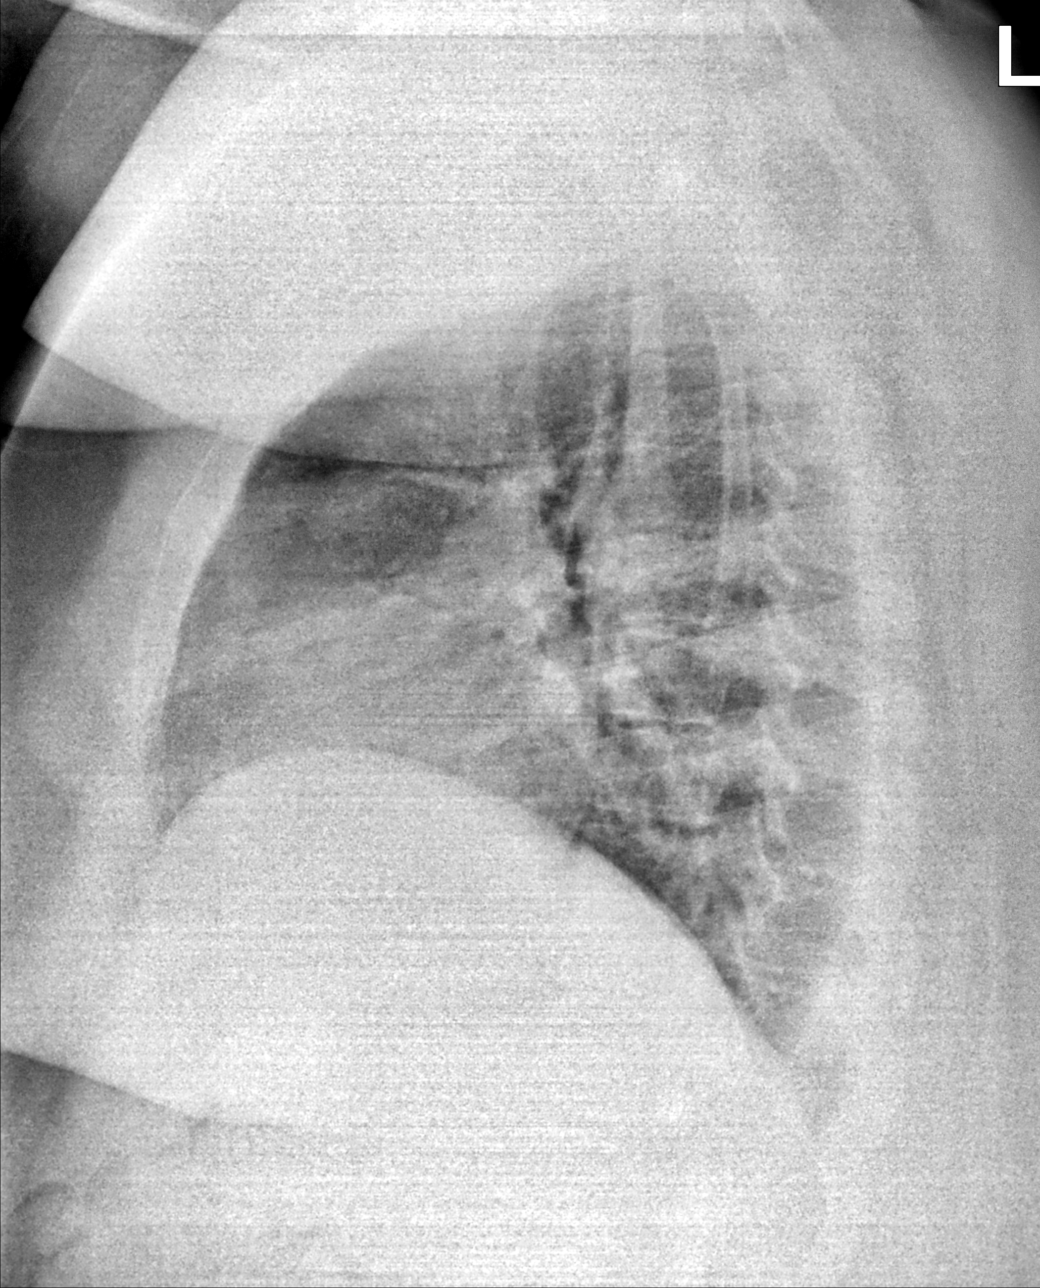

[3 of 3 positions shown; findings below may reference images not displayed]

FINDINGS: The heart size and mediastinal contours are within normal limits.
Both lungs are clear. The visualized skeletal structures are
unremarkable.
IMPRESSION: No active cardiopulmonary disease.

## 2020-01-17 IMAGING — US SOFT TISSUE ULTRASOUND HEAD/NECK
1 series · 14 of 14 positions shown · non-contrast
Comparison: None.

CLINICAL DATA: Bilateral posterior lymphadenopathy.

EXAM:
ULTRASOUND OF HEAD/NECK SOFT TISSUES
TECHNIQUE: Ultrasound examination of the head and neck soft tissues was
performed in the area of clinical concern.

[Series 1: soft tissue ultrasound head/neck · 0.05mm/px · 14 of 14 slices shown]
[im 1/14]
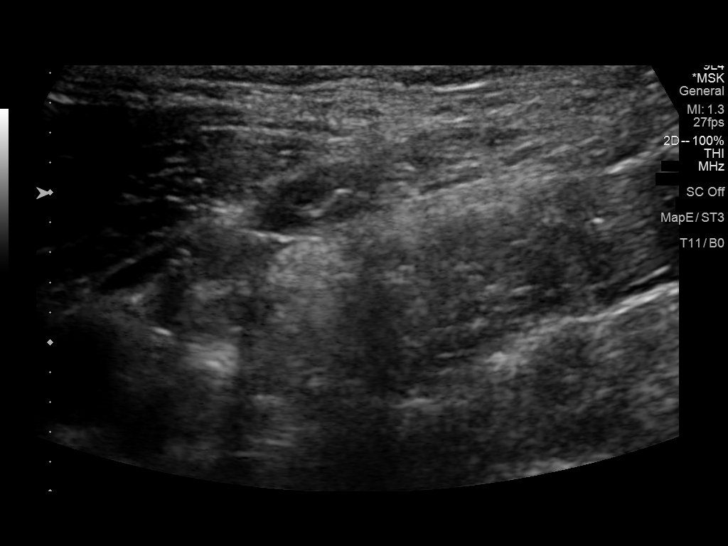
[im 2/14]
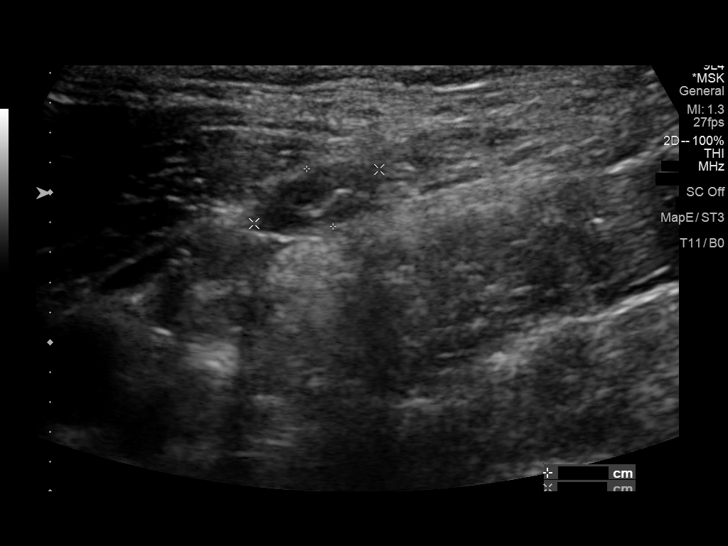
[im 3/14]
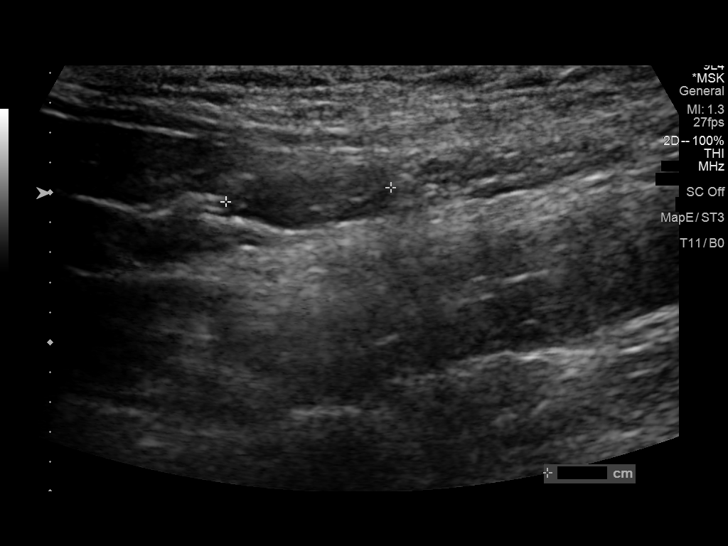
[im 4/14]
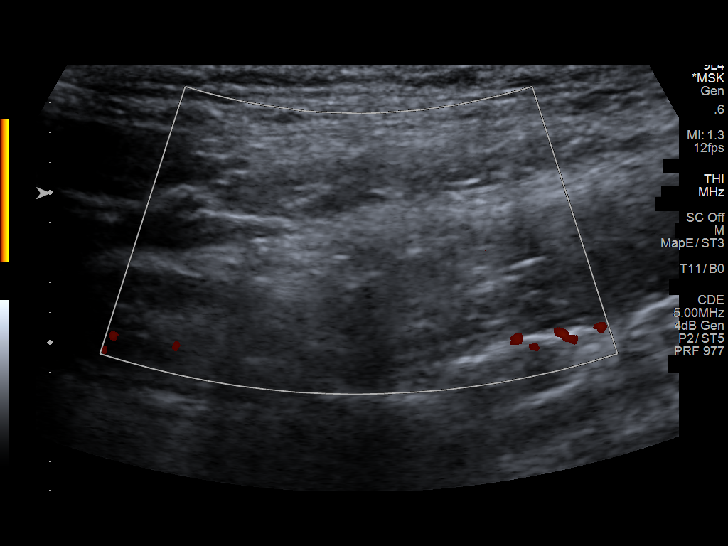
[im 5/14]
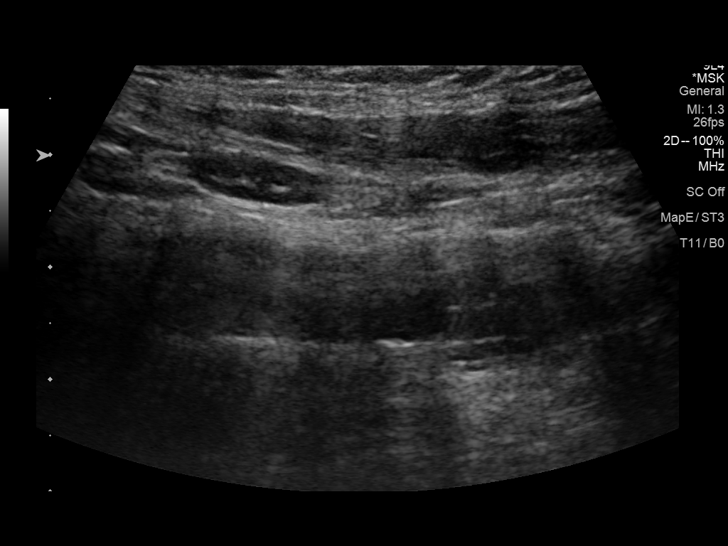
[im 6/14]
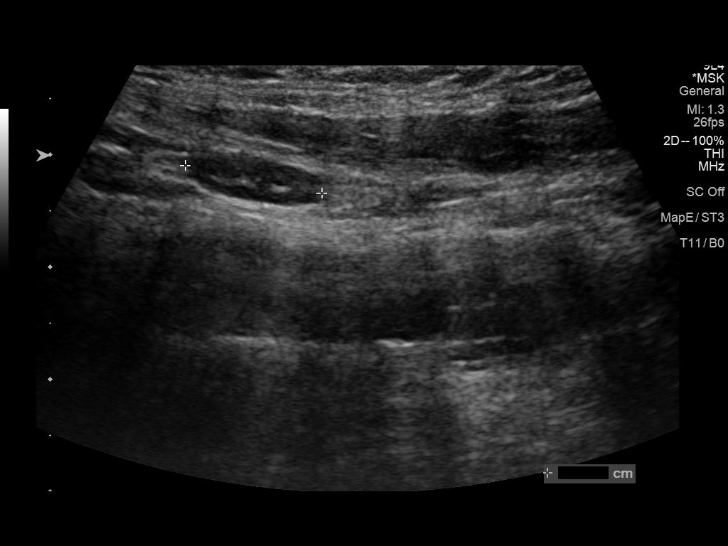
[im 7/14]
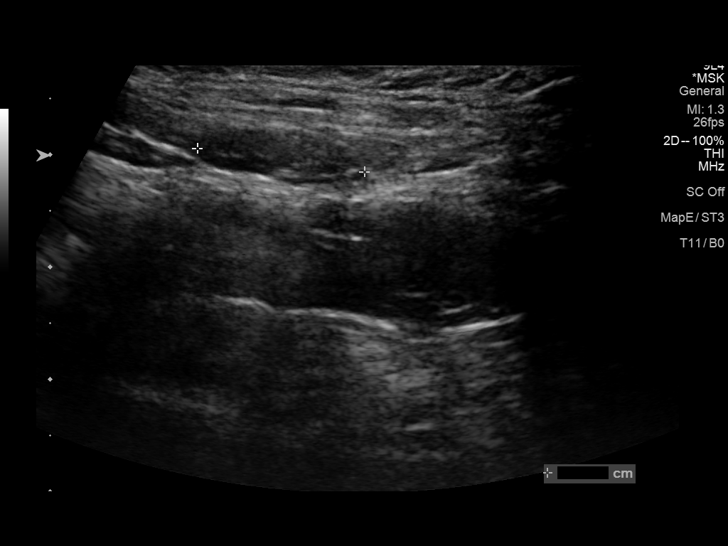
[im 8/14]
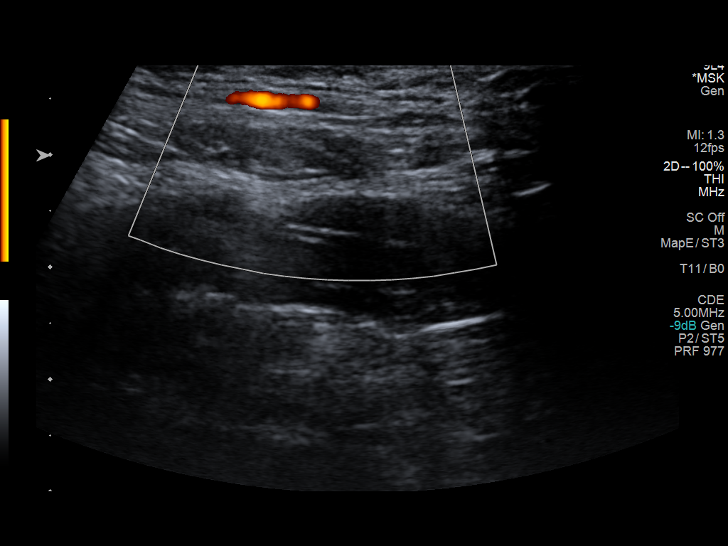
[im 9/14]
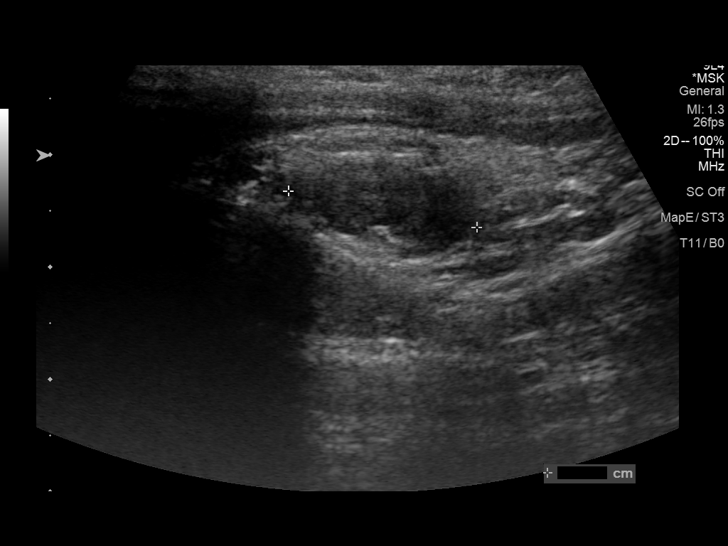
[im 10/14]
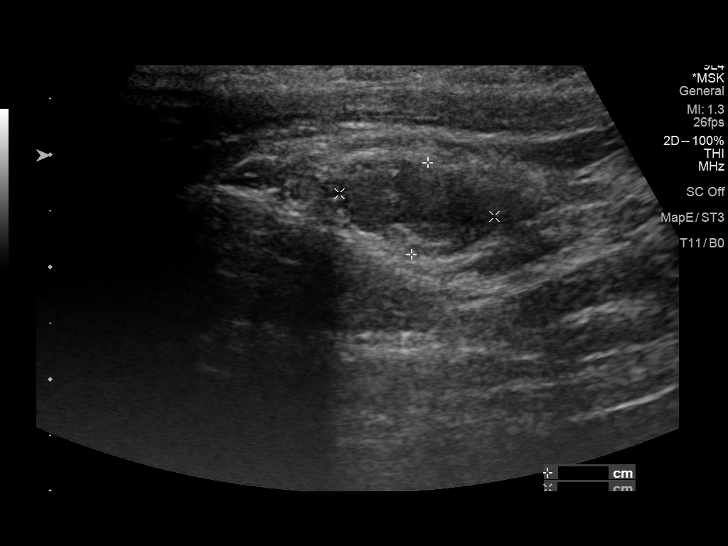
[im 11/14]
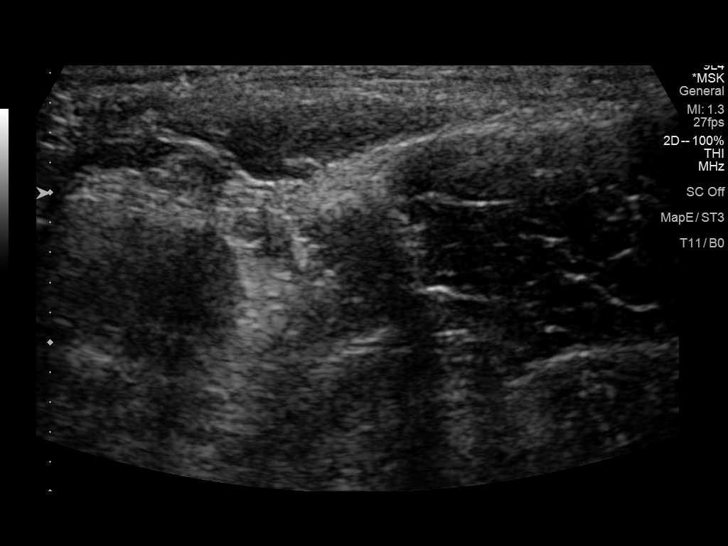
[im 12/14]
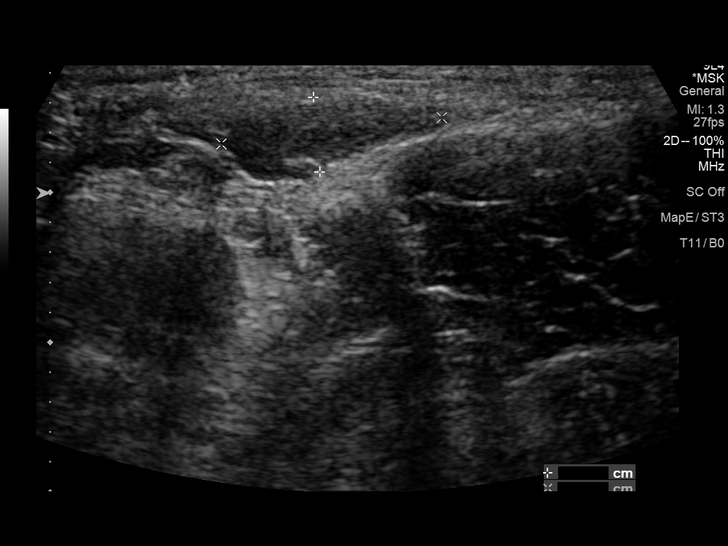
[im 13/14]
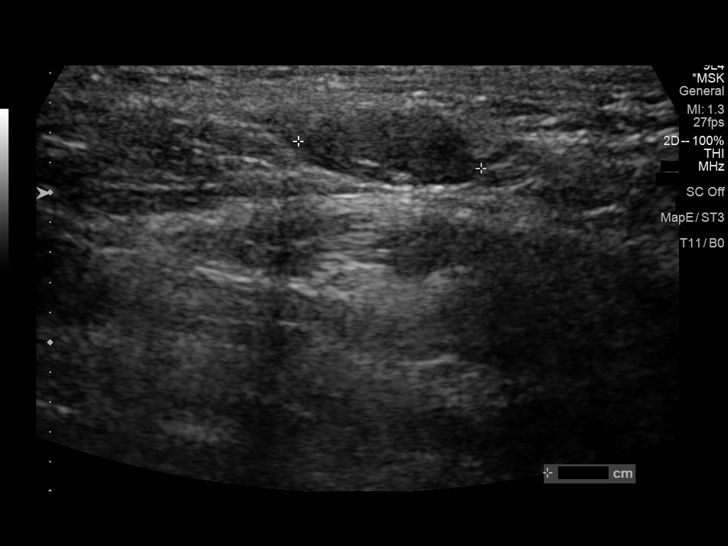
[im 14/14]
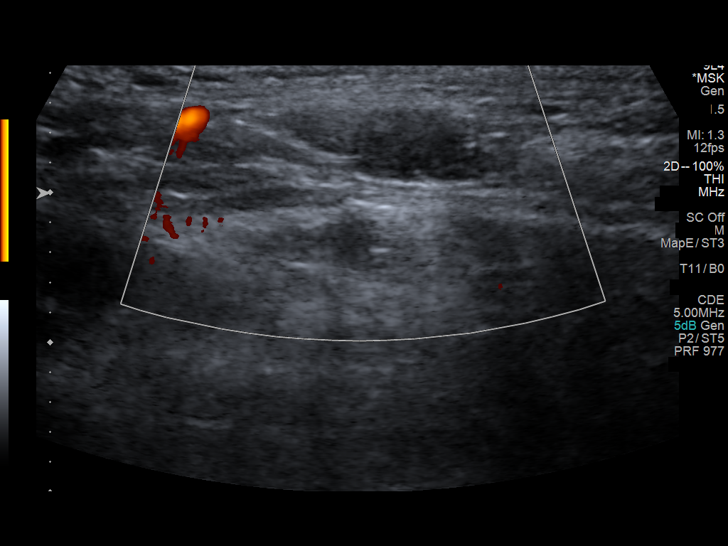

[14 of 14 positions shown; findings below may reference images not displayed]

FINDINGS: No anterior neck adenopathy is seen on either side. There are
bilateral posterior level 4 nodes identifiable, but none are
enlarged by size criteria. The lowest level 4 node on the right is
borderline, with short axis dimension of 8 mm, but retains normal
morphology and color flow pattern. No evidence of cyst or non nodal
mass.
IMPRESSION: Bilateral posterior level 4 nodes are identifiable. Usually, these
are reactive, often to folliculitis. None of the nodes are
pathologically enlarged or show necrosis or abnormal color flow. The
lowest level 4 node on the right is borderline in size measuring
short axis diameter 8 mm but is otherwise normal.

## 2020-01-26 ENCOUNTER — Ambulatory Visit: Payer: Self-pay | Admitting: Family

## 2020-01-26 DIAGNOSIS — Z0289 Encounter for other administrative examinations: Secondary | ICD-10-CM

## 2020-02-18 ENCOUNTER — Encounter: Payer: Self-pay | Admitting: Family Medicine

## 2020-02-18 ENCOUNTER — Telehealth: Payer: Self-pay | Admitting: Family Medicine

## 2020-02-18 NOTE — Telephone Encounter (Signed)
Pt was no show for appt 01/26/2020 for f/u. 2nd occurrence. 11/04/2019 & 01/26/2020  Letter mailed.

## 2020-03-12 ENCOUNTER — Ambulatory Visit: Payer: Self-pay | Admitting: Family Medicine

## 2020-04-18 ENCOUNTER — Other Ambulatory Visit: Payer: Self-pay

## 2020-04-19 ENCOUNTER — Ambulatory Visit (INDEPENDENT_AMBULATORY_CARE_PROVIDER_SITE_OTHER): Payer: 59 | Admitting: Nurse Practitioner

## 2020-04-19 ENCOUNTER — Encounter: Payer: Self-pay | Admitting: Nurse Practitioner

## 2020-04-19 VITALS — BP 122/84 | HR 77 | Temp 97.1°F | Ht 73.0 in | Wt 386.6 lb

## 2020-04-19 DIAGNOSIS — R1031 Right lower quadrant pain: Secondary | ICD-10-CM

## 2020-04-19 NOTE — Patient Instructions (Signed)
You will be contacted to schedule an appt for pelvic US.

## 2020-04-19 NOTE — Progress Notes (Signed)
   Subjective:  Patient ID: Anthony Parker, male    DOB: [birth year]  Age: 25 y.o. MRN: [id number]  CC: Acute Visit (Pt c/o swollen spot on right side in groin area since 10/2019.Pt states Anthony Parker feels spot has increased in size and sometimes the pain is mirrored on the right side but it is not as swollen on that side. )  HPI Anthony Parker presents with worsening right groin pain since 10/2019, denies any pelvis injury, no change in urination or bowel pattern. Anthony Parker is not sexually active.  Reviewed past Medical, Social and Family history today.  Outpatient Medications Prior to Visit  Medication Sig Dispense Refill  . ALBUTEROL SULFATE HFA IN Inhale into the lungs.    . metoprolol tartrate (LOPRESSOR) 25 MG tablet Take 25 mg by mouth 2 (two) times daily.    Marland Kitchen dexamethasone (DECADRON) 1 MG tablet Please take tablet between 11PM and 12PM and report to our lab the next morning at 8 for scheduled labs. (Patient not taking: Reported on 04/19/2020) 1 tablet 0  . fluticasone (FLONASE) 50 MCG/ACT nasal spray Place 2 sprays into both nostrils daily. (Patient not taking: Reported on 04/19/2020) 16 g 6  . sertraline (ZOLOFT) 50 MG tablet Take 1 tablet by mouth daily. (Patient not taking: Reported on 04/19/2020)    . topiramate (TOPAMAX) 25 MG tablet  (Patient not taking: Reported on 04/19/2020)     No facility-administered medications prior to visit.    ROS See HPI  Objective:  BP 122/84 (BP Location: Left Arm, Patient Position: Sitting, Cuff Size: Large)   Pulse 77   Temp (!) 97.1 F (36.2 C) (Temporal)   Ht 6\' 1"  (1.854 m)   Wt (!) 386 lb 9.6 oz (175.4 kg)   SpO2 98%   BMI 51.01 kg/m   Physical Exam Vitals reviewed. Exam conducted with a chaperone present.  Constitutional:      Appearance: Anthony Parker is obese.  Abdominal:     Hernia: There is no hernia in the left inguinal area or right inguinal area.  Genitourinary:    Penis: Circumcised. No erythema, swelling or lesions.      Testes: Normal.  Cremasteric reflex is present.     Epididymis:     Right: Normal.     Left: Normal.  Lymphadenopathy:     Lower Body: No right inguinal adenopathy. No left inguinal adenopathy.  Neurological:     Mental Status: Anthony Parker is alert.    Assessment & Plan:  This visit occurred during the SARS-CoV-2 public health emergency.  Safety protocols were in place, including screening questions prior to the visit, additional usage of staff PPE, and extensive cleaning of exam room while observing appropriate contact time as indicated for disinfecting solutions.   Anthony Parker was seen today for acute visit.  Diagnoses and all orders for this visit:  Right groin pain -     PELVIS (TRANSABDOMINAL ONLY); Future  [place] to eval for femoral or inguinal hernia  Problem List Items Addressed This Visit   None   Visit Diagnoses    Right groin pain    -  Primary   Relevant Orders   US PELVIS (TRANSABDOMINAL ONLY)      Follow-up: Return if symptoms worsen or fail to improve.  [place], NP

## 2020-04-24 ENCOUNTER — Other Ambulatory Visit: Payer: 59

## 2020-04-26 ENCOUNTER — Ambulatory Visit
Admission: RE | Admit: 2020-04-26 | Discharge: 2020-04-26 | Disposition: A | Discharge status: Home / Self Care | Payer: 59 | Source: Ambulatory Visit | Attending: Nurse Practitioner | Admitting: Nurse Practitioner

## 2020-04-26 DIAGNOSIS — R1031 Right lower quadrant pain: Secondary | ICD-10-CM

## 2020-05-15 ENCOUNTER — Encounter: Payer: Self-pay | Admitting: Family Medicine

## 2020-08-14 ENCOUNTER — Other Ambulatory Visit: Payer: Self-pay

## 2020-08-14 ENCOUNTER — Ambulatory Visit (INDEPENDENT_AMBULATORY_CARE_PROVIDER_SITE_OTHER): Payer: 59 | Admitting: Family Medicine

## 2020-08-14 ENCOUNTER — Encounter: Payer: Self-pay | Admitting: Family Medicine

## 2020-08-14 VITALS — BP 122/76 | HR 85 | Temp 98.0°F | Ht 73.0 in | Wt 373.0 lb

## 2020-08-14 DIAGNOSIS — R7309 Other abnormal glucose: Secondary | ICD-10-CM

## 2020-08-14 DIAGNOSIS — R030 Elevated blood-pressure reading, without diagnosis of hypertension: Secondary | ICD-10-CM

## 2020-08-14 DIAGNOSIS — Z6841 Body Mass Index (BMI) 40.0 and over, adult: Secondary | ICD-10-CM

## 2020-08-14 DIAGNOSIS — R7989 Other specified abnormal findings of blood chemistry: Secondary | ICD-10-CM

## 2020-08-14 DIAGNOSIS — F418 Other specified anxiety disorders: Secondary | ICD-10-CM | POA: Diagnosis not present

## 2020-08-14 DIAGNOSIS — Z0000 Encounter for general adult medical examination without abnormal findings: Secondary | ICD-10-CM

## 2020-08-14 DIAGNOSIS — E559 Vitamin D deficiency, unspecified: Secondary | ICD-10-CM

## 2020-08-14 MED ORDER — OZEMPIC (0.25 OR 0.5 MG/DOSE) 2 MG/1.5ML ~~LOC~~ SOPN: 0.5000 mg | PEN_INJECTOR | SUBCUTANEOUS | 1 refills | Status: DC

## 2020-08-14 MED ORDER — SERTRALINE HCL 25 MG PO TABS: ORAL_TABLET | ORAL | 9 refills | Status: DC

## 2020-08-14 NOTE — Progress Notes (Addendum)
Established Patient Office Visit  Subjective:  Patient ID: Anthony Parker, male    DOB: 1996/01/29  Age: 25 y.o. MRN: [id number]  CC:  Chief Complaint  Patient presents with   Follow-up    Follow up on diabetes and anxiety. Pt states last A1C was 9.1 in Feb. 2022. Pt states he does check blood sugars at home and they have been less than 100.     HPI [name] presents for follow-up of elevated glucose and anxiety.  Patient said that his hemoglobin A1c had been up to 9 and another healthcare system.  He has been taking Glucophage 500 twice daily without issue.  He is interested in semaglutide.  History of anxiety.  He had taken Zoloft successfully for this problem in the past but discontinued it in December.  He would like to restart this medication.  History of adenopathy in the right inguinal area.  He does not feel there has been change.  History reviewed. No pertinent past medical history.  History reviewed. No pertinent surgical history.  Family History  Problem Relation Age of Onset   Hypertension Mother    Diabetes Mother    Hypertension Father    Diabetes Father    Hypertension Maternal Grandfather    Diabetes Maternal Grandfather    Hypertension Paternal Grandmother    Diabetes Paternal Grandmother    Hypertension Paternal Grandfather    Diabetes Paternal Grandfather    Hypertension Other    Diabetes Other     Social History   Socioeconomic History   Marital status: Single    Spouse name: Not on file   Number of children: Not on file   Years of education: Not on file   Highest education level: Not on file  Occupational History   Not on file  Tobacco Use   Smoking status: Never   Smokeless tobacco: Never  Vaping Use   Vaping Use: Never used  Substance and Sexual Activity   Alcohol use: Never   Drug use: Never   Sexual activity: Not on file  Other Topics Concern   Not on file  Social History Narrative   Not on file   Social Determinants of  Health   Financial Resource Strain: Not on file  Food Insecurity: Not on file  Transportation Needs: Not on file  Physical Activity: Not on file  Stress: Not on file  Social Connections: Not on file  Intimate Partner Violence: Not on file    Outpatient Medications Prior to Visit  Medication Sig Dispense Refill   ALBUTEROL SULFATE HFA IN Inhale into the lungs.     metFORMIN (GLUCOPHAGE) 500 MG tablet Take 500 mg by mouth 2 (two) times daily.     metoprolol tartrate (LOPRESSOR) 25 MG tablet Take 25 mg by mouth 2 (two) times daily.     No facility-administered medications prior to visit.    No Known Allergies  ROS Review of Systems  Constitutional: Negative.  Negative for diaphoresis, fatigue, fever and unexpected weight change.  Respiratory: Negative.    Cardiovascular: Negative.   Gastrointestinal: Negative.   Endocrine: Negative for polyphagia and polyuria.  Genitourinary: Negative.   Neurological: Negative.      Depression screen PHQ 2/9 08/14/2020  Decreased Interest 1  Down, Depressed, Hopeless 2  PHQ - 2 Score 3  Altered sleeping 3  Tired, decreased energy 0  Change in appetite 2  Feeling bad or failure about yourself  1  Trouble concentrating 3  Moving slowly  or fidgety/restless 0  Suicidal thoughts 1  PHQ-9 Score 13  Difficult doing work/chores Very difficult     Objective:    Physical Exam Vitals and nursing note reviewed.  Constitutional:      General: He is not in acute distress.    Appearance: Normal appearance. He is obese. He is not ill-appearing, toxic-appearing or diaphoretic.  HENT:     Head: Normocephalic and atraumatic.     Right Ear: Tympanic membrane, ear canal and external ear normal.     Left Ear: Tympanic membrane, ear canal and external ear normal.     Mouth/Throat:     Mouth: Mucous membranes are moist.     Pharynx: Oropharynx is clear. No oropharyngeal exudate or posterior oropharyngeal erythema.  Eyes:     General: No scleral  icterus.       Right eye: No discharge.        Left eye: No discharge.     Extraocular Movements: Extraocular movements intact.     Conjunctiva/sclera: Conjunctivae normal.     Pupils: Pupils are equal, round, and reactive to light.  Cardiovascular:     Rate and Rhythm: Normal rate and regular rhythm.  Pulmonary:     Effort: Pulmonary effort is normal.     Breath sounds: Normal breath sounds.  Abdominal:     General: Bowel sounds are normal.     Hernia: There is no hernia in the left inguinal area or right inguinal area.  Genitourinary:    Penis: Circumcised. No hypospadias, erythema, tenderness, discharge or swelling.      Testes:        Right: Mass, tenderness or swelling not present. Right testis is descended.        Left: Mass, tenderness or swelling not present. Left testis is descended.     Epididymis:     Right: Not inflamed.     Left: Not inflamed.  Musculoskeletal:     Cervical back: No rigidity or tenderness.  Lymphadenopathy:     Cervical: No cervical adenopathy.     Lower Body: Right inguinal adenopathy (lymph tissue in right inquinal area unchanged) present.  Skin:    General: Skin is warm and dry.  Neurological:     Mental Status: He is alert and oriented to person, place, and time.  Psychiatric:        Mood and Affect: Mood normal.        Behavior: Behavior normal.    BP 122/76 (BP Location: Left Arm, Patient Position: Sitting, Cuff Size: Large)   Pulse 85   Temp 98 F (36.7 C) (Temporal)   Ht 6\' 1"  (1.854 m)   Wt (!) 373 lb (169.2 kg)   SpO2 98%   BMI 49.21 kg/m  Wt Readings from Last 3 Encounters:  08/14/20 (!) 373 lb (169.2 kg)  04/19/20 (!) 386 lb 9.6 oz (175.4 kg)  06/08/18 (!) 384 lb (174.2 kg)     Health Maintenance Due  Topic Date Due   COVID-19 Vaccine (1) Never done   HPV VACCINES (1 - Male 2-dose series) Never done   Hepatitis C Screening  Never done   TETANUS/TDAP  Never done   INFLUENZA VACCINE  09/10/2020       Topic Date Due    HPV VACCINES (1 - Male 2-dose series) Never done    Lab Results  Component Value Date   TSH 2.78 08/15/2020   Lab Results  Component Value Date   WBC 7.8 08/15/2020  HGB 12.8 (L) 08/15/2020   HCT 39.1 08/15/2020   MCV 84.2 08/15/2020   PLT 281.0 08/15/2020   Lab Results  Component Value Date   NA 137 08/15/2020   K 4.4 08/15/2020   CO2 26 08/15/2020   GLUCOSE 97 08/15/2020   BUN 12 08/15/2020   CREATININE 0.78 08/15/2020   BILITOT 0.5 08/15/2020   ALKPHOS 81 08/15/2020   AST 14 08/15/2020   ALT 15 08/15/2020   PROT 7.2 08/15/2020   ALBUMIN 3.9 08/15/2020   CALCIUM 9.4 08/15/2020   GFR 125.24 08/15/2020   Lab Results  Component Value Date   CHOL 173 08/15/2020   Lab Results  Component Value Date   HDL 39.60 08/15/2020   Lab Results  Component Value Date   LDLCALC 123 (H) 08/15/2020   Lab Results  Component Value Date   TRIG 50.0 08/15/2020   Lab Results  Component Value Date   CHOLHDL 4 08/15/2020   Lab Results  Component Value Date   HGBA1C 5.1 08/15/2020      Assessment & Plan:   Problem List Items Addressed This Visit       Other   Class 3 severe obesity due to excess calories without serious comorbidity with body mass index (BMI) of 45.0 to 49.9 in adult [facility])   Relevant Medications   metFORMIN (GLUCOPHAGE) 500 MG tablet   Semaglutide,0.25 or 0.5MG /DOS, (OZEMPIC, 0.25 OR 0.5 MG/DOSE,) 2 MG/1.5ML SOPN   Other Relevant Orders   VITAMIN D 25 Hydroxy (Vit-D Deficiency, Fractures) (Completed)   Healthcare maintenance   Relevant Orders   Lipid panel (Completed)   Urinalysis, Routine w reflex microscopic (Completed)   Elevated TSH   Relevant Orders   TSH (Completed)   Elevated glucose - Primary   Relevant Medications   Semaglutide,0.25 or 0.5MG /DOS, (OZEMPIC, 0.25 OR 0.5 MG/DOSE,) 2 MG/1.5ML SOPN   Other Relevant Orders   CBC (Completed)   Comprehensive metabolic panel (Completed)   Hemoglobin A1c (Completed)   Microalbumin /  creatinine urine ratio (Completed)   Anxiety with depression   Relevant Medications   sertraline (ZOLOFT) 25 MG tablet   metoprolol tartrate (LOPRESSOR) 25 MG tablet   Vitamin D deficiency   Relevant Medications   Vitamin D, Ergocalciferol, (DRISDOL) 1.25 MG (50000 UNIT) CAPS capsule   Elevated BP without diagnosis of hypertension   Relevant Medications   metoprolol tartrate (LOPRESSOR) 25 MG tablet    Meds ordered this encounter  Medications   sertraline (ZOLOFT) 25 MG tablet    Sig: Take 1 tablet (25 mg total) by mouth daily for 7 days, THEN 2 tablets (50 mg total) daily.    Dispense:  67 tablet    Refill:  9   Semaglutide,0.25 or 0.5MG /DOS, (OZEMPIC, 0.25 OR 0.5 MG/DOSE,) 2 MG/1.5ML SOPN    Sig: Inject 0.5 mg into the skin once a week.    Dispense:  6 mL    Refill:  1    Appropriate pen needles.   Vitamin D, Ergocalciferol, (DRISDOL) 1.25 MG (50000 UNIT) CAPS capsule    Sig: Take 1 capsule (50,000 Units total) by mouth every 7 (seven) days.    Dispense:  20 capsule    Refill:  2   metoprolol tartrate (LOPRESSOR) 25 MG tablet    Sig: Take 1 tablet (25 mg total) by mouth 2 (two) times daily.    Dispense:  60 tablet    Refill:  2    Follow-up: Return in about 5 weeks (around 09/18/2020), or  return fasting for blood work ordered today..    discussed semaglutide side effects and route of administration.  Pharmacy will also instruct.  After reviewing the PHQ-9 believe that patient could benefit from a higher dose.  We will start at 25 mg of Zoloft increased to 50 after a week.  Patient agrees to do so.  We will continue metformin at current dose.  Patient has been taking metoprolol for anxiety but feels as though it is always is also helping his blood pressure.  We will continue it. Mliss Sax, MD

## 2020-08-15 ENCOUNTER — Other Ambulatory Visit (INDEPENDENT_AMBULATORY_CARE_PROVIDER_SITE_OTHER): Payer: 59

## 2020-08-15 DIAGNOSIS — Z6841 Body Mass Index (BMI) 40.0 and over, adult: Secondary | ICD-10-CM | POA: Diagnosis not present

## 2020-08-15 DIAGNOSIS — R7309 Other abnormal glucose: Secondary | ICD-10-CM

## 2020-08-15 DIAGNOSIS — R7989 Other specified abnormal findings of blood chemistry: Secondary | ICD-10-CM | POA: Diagnosis not present

## 2020-08-15 DIAGNOSIS — Z0000 Encounter for general adult medical examination without abnormal findings: Secondary | ICD-10-CM

## 2020-08-15 LAB — CBC
HCT: 39.1 % (ref 39.0–52.0)
Hemoglobin: 12.8 g/dL — ABNORMAL LOW (ref 13.0–17.0)
MCHC: 32.7 g/dL (ref 30.0–36.0)
MCV: 84.2 fl (ref 78.0–100.0)
Platelets: 281 10*3/uL (ref 150.0–400.0)
RBC: 4.64 Mil/uL (ref 4.22–5.81)
RDW: 13.6 % (ref 11.5–15.5)
WBC: 7.8 10*3/uL (ref 4.0–10.5)

## 2020-08-15 LAB — COMPREHENSIVE METABOLIC PANEL
ALT: 15 U/L (ref 0–53)
AST: 14 U/L (ref 0–37)
Albumin: 3.9 g/dL (ref 3.5–5.2)
Alkaline Phosphatase: 81 U/L (ref 39–117)
BUN: 12 mg/dL (ref 6–23)
CO2: 26 mEq/L (ref 19–32)
Calcium: 9.4 mg/dL (ref 8.4–10.5)
Chloride: 103 mEq/L (ref 96–112)
Creatinine, Ser: 0.78 mg/dL (ref 0.40–1.50)
GFR: 125.24 mL/min (ref >60.00)
Glucose, Bld: 97 mg/dL (ref 70–99)
Potassium: 4.4 mEq/L (ref 3.5–5.1)
Sodium: 137 mEq/L (ref 135–145)
Total Bilirubin: 0.5 mg/dL (ref 0.2–1.2)
Total Protein: 7.2 g/dL (ref 6.0–8.3)

## 2020-08-15 LAB — LIPID PANEL
Cholesterol: 173 mg/dL (ref 0–200)
HDL: 39.6 mg/dL (ref >39.00)
LDL Cholesterol: 123 mg/dL — ABNORMAL HIGH (ref 0–99)
NonHDL: 133.14
Total CHOL/HDL Ratio: 4
Triglycerides: 50 mg/dL (ref 0.0–149.0)
VLDL: 10 mg/dL (ref 0.0–40.0)

## 2020-08-15 LAB — URINALYSIS, ROUTINE W REFLEX MICROSCOPIC
Bilirubin Urine: NEGATIVE
Hgb urine dipstick: NEGATIVE
Ketones, ur: NEGATIVE
Leukocytes,Ua: NEGATIVE
Nitrite: NEGATIVE
RBC / HPF: NONE SEEN (ref 0–?)
Specific Gravity, Urine: >=1.03 — AB (ref 1.000–1.030)
Total Protein, Urine: NEGATIVE
Urine Glucose: NEGATIVE
Urobilinogen, UA: 0.2 (ref 0.0–1.0)
pH: 6 (ref 5.0–8.0)

## 2020-08-15 LAB — HEMOGLOBIN A1C: Hgb A1c MFr Bld: 5.1 % (ref 4.6–6.5)

## 2020-08-15 LAB — MICROALBUMIN / CREATININE URINE RATIO
Creatinine,U: 221.7 mg/dL
Microalb Creat Ratio: 0.7 mg/g (ref 0.0–30.0)
Microalb, Ur: 1.6 mg/dL (ref 0.0–1.9)

## 2020-08-15 LAB — TSH: TSH: 2.78 u[IU]/mL (ref 0.35–5.50)

## 2020-08-15 LAB — VITAMIN D 25 HYDROXY (VIT D DEFICIENCY, FRACTURES): VITD: 14.16 ng/mL — ABNORMAL LOW (ref 30.00–100.00)

## 2020-08-15 NOTE — Progress Notes (Signed)
Per the orders of Dr. Doreene Burke pt is here for labs pt tolerated draw well. Pt was able to provide adequate urine sample.

## 2020-08-16 MED ORDER — VITAMIN D (ERGOCALCIFEROL) 1.25 MG (50000 UNIT) PO CAPS: 50000.0000 [IU] | ORAL_CAPSULE | ORAL | 2 refills | Status: DC

## 2020-08-16 NOTE — Addendum Note (Signed)
Addended by: Andrez Grime on: 08/16/2020 04:32 PM   Modules accepted: Orders

## 2020-08-20 ENCOUNTER — Encounter: Payer: Self-pay | Admitting: Family Medicine

## 2020-08-21 MED ORDER — TRAZODONE HCL 50 MG PO TABS: 25.0000 mg | ORAL_TABLET | Freq: Every evening | ORAL | 0 refills | Status: DC | PRN

## 2020-08-23 ENCOUNTER — Other Ambulatory Visit: Payer: Self-pay

## 2020-08-23 DIAGNOSIS — Z0100 Encounter for examination of eyes and vision without abnormal findings: Secondary | ICD-10-CM

## 2020-08-23 NOTE — Progress Notes (Signed)
Error message

## 2020-09-10 DIAGNOSIS — E559 Vitamin D deficiency, unspecified: Secondary | ICD-10-CM | POA: Insufficient documentation

## 2020-09-10 DIAGNOSIS — R030 Elevated blood-pressure reading, without diagnosis of hypertension: Secondary | ICD-10-CM | POA: Insufficient documentation

## 2020-09-10 MED ORDER — METOPROLOL TARTRATE 25 MG PO TABS: 25.0000 mg | ORAL_TABLET | Freq: Two times a day (BID) | ORAL | 2 refills | Status: DC

## 2020-09-10 NOTE — Addendum Note (Signed)
Addended by: Andrez Grime on: 09/10/2020 05:53 PM   Modules accepted: Orders

## 2020-09-18 ENCOUNTER — Ambulatory Visit (INDEPENDENT_AMBULATORY_CARE_PROVIDER_SITE_OTHER): Payer: 59 | Admitting: Family Medicine

## 2020-09-18 ENCOUNTER — Other Ambulatory Visit: Payer: Self-pay

## 2020-09-18 ENCOUNTER — Encounter: Payer: Self-pay | Admitting: Family Medicine

## 2020-09-18 VITALS — BP 116/74 | HR 78 | Temp 97.8°F | Ht 73.0 in | Wt 361.0 lb

## 2020-09-18 DIAGNOSIS — E559 Vitamin D deficiency, unspecified: Secondary | ICD-10-CM | POA: Diagnosis not present

## 2020-09-18 DIAGNOSIS — Z6841 Body Mass Index (BMI) 40.0 and over, adult: Secondary | ICD-10-CM | POA: Diagnosis not present

## 2020-09-18 DIAGNOSIS — R7303 Prediabetes: Secondary | ICD-10-CM | POA: Diagnosis not present

## 2020-09-18 MED ORDER — SEMAGLUTIDE(0.25 OR 0.5MG/DOS) 2 MG/1.5ML ~~LOC~~ SOPN: PEN_INJECTOR | SUBCUTANEOUS | 0 refills | Status: DC

## 2020-09-18 NOTE — Progress Notes (Signed)
Established Patient Office Visit  Subjective:  Patient ID: Anthony Parker, male    DOB: 09/24/1995  Age: 25 y.o. MRN: [id number]  CC:  Chief Complaint  Patient presents with   Follow-up    Follow up on medications, patient would like Ozempic increased.     HPI Anthony Parker presents for follow-up of obesity, vitamin D deficiency, and prediabetes.  He is doing well with the semaglutide.  Denies nausea or vomiting.  No family history of thyroid cancer.  He is actually been able to lose some weight.  He has tolerated the metformin for prediabetes.  Reminds me that he has a strong family history of diabetes.  He is taking the high-dose vitamin D weekly without issue.  No past medical history on file.  No past surgical history on file.  Family History  Problem Relation Age of Onset   Hypertension Mother    Diabetes Mother    Hypertension Father    Diabetes Father    Hypertension Maternal Grandfather    Diabetes Maternal Grandfather    Hypertension Paternal Grandmother    Diabetes Paternal Grandmother    Hypertension Paternal Grandfather    Diabetes Paternal Grandfather    Hypertension Other    Diabetes Other     Social History   Socioeconomic History   Marital status: Single    Spouse name: Not on file   Number of children: Not on file   Years of education: Not on file   Highest education level: Not on file  Occupational History   Not on file  Tobacco Use   Smoking status: Never   Smokeless tobacco: Never  Vaping Use   Vaping Use: Never used  Substance and Sexual Activity   Alcohol use: Never   Drug use: Never   Sexual activity: Not on file  Other Topics Concern   Not on file  Social History Narrative   Not on file   Social Determinants of Health   Financial Resource Strain: Not on file  Food Insecurity: Not on file  Transportation Needs: Not on file  Physical Activity: Not on file  Stress: Not on file  Social Connections: Not on file  Intimate  Partner Violence: Not on file    Outpatient Medications Prior to Visit  Medication Sig Dispense Refill   ALBUTEROL SULFATE HFA IN Inhale into the lungs.     metFORMIN (GLUCOPHAGE) 500 MG tablet Take 500 mg by mouth 2 (two) times daily.     metoprolol tartrate (LOPRESSOR) 25 MG tablet Take 1 tablet (25 mg total) by mouth 2 (two) times daily. 60 tablet 2   sertraline (ZOLOFT) 25 MG tablet Take 1 tablet (25 mg total) by mouth daily for 7 days, THEN 2 tablets (50 mg total) daily. 67 tablet 9   Vitamin D, Ergocalciferol, (DRISDOL) 1.25 MG (50000 UNIT) CAPS capsule Take 1 capsule (50,000 Units total) by mouth every 7 (seven) days. 20 capsule 2   Semaglutide,0.25 or 0.5MG /DOS, (OZEMPIC, 0.25 OR 0.5 MG/DOSE,) 2 MG/1.5ML SOPN Inject 0.5 mg into the skin once a week. 6 mL 1   traZODone (DESYREL) 50 MG tablet Take 0.5-1 tablets (25-50 mg total) by mouth at bedtime as needed for sleep. (Patient not taking: Reported on 09/18/2020) 30 tablet 0   No facility-administered medications prior to visit.    No Known Allergies  ROS Review of Systems  Constitutional: Negative.   Respiratory: Negative.    Cardiovascular: Negative.   Gastrointestinal: Negative.   Endocrine: Negative  for polyphagia and polyuria.  Genitourinary: Negative.   Neurological: Negative.   Psychiatric/Behavioral: Negative.       Objective:    Physical Exam Vitals and nursing note reviewed.  Constitutional:      General: He is not in acute distress.    Appearance: Normal appearance. He is obese. He is not ill-appearing, toxic-appearing or diaphoretic.  HENT:     Head: Normocephalic and atraumatic.     Right Ear: External ear normal.     Left Ear: External ear normal.  Eyes:     General:        Right eye: No discharge.        Left eye: No discharge.     Extraocular Movements: Extraocular movements intact.     Conjunctiva/sclera: Conjunctivae normal.  Cardiovascular:     Rate and Rhythm: Normal rate and regular rhythm.   Pulmonary:     Effort: Pulmonary effort is normal.     Breath sounds: Normal breath sounds.  Musculoskeletal:     Cervical back: No rigidity or tenderness.  Lymphadenopathy:     Cervical: No cervical adenopathy.  Skin:    General: Skin is warm and dry.  Neurological:     Mental Status: He is alert and oriented to person, place, and time.  Psychiatric:        Mood and Affect: Mood normal.        Behavior: Behavior normal.    BP 116/74 (BP Location: Left Arm, Patient Position: Sitting, Cuff Size: Large)   Pulse 78   Temp 97.8 F (36.6 C) (Temporal)   Ht 6\' 1"  (1.854 m)   Wt (!) 361 lb (163.7 kg)   SpO2 97%   BMI 47.63 kg/m  Wt Readings from Last 3 Encounters:  09/18/20 (!) 361 lb (163.7 kg)  08/14/20 (!) 373 lb (169.2 kg)  04/19/20 (!) 386 lb 9.6 oz (175.4 kg)     Health Maintenance Due  Topic Date Due   HPV VACCINES (1 - Male 2-dose series) Never done   Hepatitis C Screening  Never done   TETANUS/TDAP  Never done   INFLUENZA VACCINE  09/10/2020       Topic Date Due   HPV VACCINES (1 - Male 2-dose series) Never done    Lab Results  Component Value Date   TSH 2.78 08/15/2020   Lab Results  Component Value Date   WBC 7.8 08/15/2020   HGB 12.8 (L) 08/15/2020   HCT 39.1 08/15/2020   MCV 84.2 08/15/2020   PLT 281.0 08/15/2020   Lab Results  Component Value Date   NA 137 08/15/2020   K 4.4 08/15/2020   CO2 26 08/15/2020   GLUCOSE 97 08/15/2020   BUN 12 08/15/2020   CREATININE 0.78 08/15/2020   BILITOT 0.5 08/15/2020   ALKPHOS 81 08/15/2020   AST 14 08/15/2020   ALT 15 08/15/2020   PROT 7.2 08/15/2020   ALBUMIN 3.9 08/15/2020   CALCIUM 9.4 08/15/2020   GFR 125.24 08/15/2020   Lab Results  Component Value Date   CHOL 173 08/15/2020   Lab Results  Component Value Date   HDL 39.60 08/15/2020   Lab Results  Component Value Date   LDLCALC 123 (H) 08/15/2020   Lab Results  Component Value Date   TRIG 50.0 08/15/2020   Lab Results   Component Value Date   CHOLHDL 4 08/15/2020   Lab Results  Component Value Date   HGBA1C 5.1 08/15/2020      Assessment &  Plan:   Problem List Items Addressed This Visit       Other   Class 3 severe obesity due to excess calories without serious comorbidity with body mass index (BMI) of 45.0 to 49.9 in adult [facility]) - Primary   Relevant Medications   Semaglutide,0.25 or 0.5MG /DOS, 2 MG/1.5ML SOPN   Vitamin D deficiency   Pre-diabetes   Relevant Medications   Semaglutide,0.25 or 0.5MG /DOS, 2 MG/1.5ML SOPN    Meds ordered this encounter  Medications   Semaglutide,0.25 or 0.5MG /DOS, 2 MG/1.5ML SOPN    Sig: Inject 0.5 mg into the skin once a week for 30 days, THEN 1 mg once a week.    Dispense:  7.5 mL    Refill:  0    Follow-up: Return in about 3 months (around 12/19/2020).  Continue semaglutide.  Increase the dose to 0.5 weekly for a month and then to 1 mg weekly for 2 months.  Continue and vitamin D.  Mliss Sax, MD

## 2020-10-11 ENCOUNTER — Telehealth: Payer: Self-pay

## 2020-10-11 MED ORDER — SEMAGLUTIDE (1 MG/DOSE) 4 MG/3ML ~~LOC~~ SOPN: 1.0000 mg | PEN_INJECTOR | SUBCUTANEOUS | 3 refills | Status: DC

## 2020-10-11 NOTE — Telephone Encounter (Signed)
Pharmacy calling states that patient needs Ozempic Rx pens sent for 1mg  dose due to patient will be starting this soon. Please advise

## 2020-10-24 ENCOUNTER — Encounter: Payer: Self-pay | Admitting: Family Medicine

## 2020-10-30 ENCOUNTER — Ambulatory Visit: Payer: 59 | Admitting: Family Medicine

## 2020-11-19 ENCOUNTER — Encounter: Payer: Self-pay | Admitting: Family Medicine

## 2020-11-19 ENCOUNTER — Ambulatory Visit (INDEPENDENT_AMBULATORY_CARE_PROVIDER_SITE_OTHER): Payer: 59 | Admitting: Family Medicine

## 2020-11-19 ENCOUNTER — Other Ambulatory Visit: Payer: Self-pay

## 2020-11-19 VITALS — BP 124/82 | HR 88 | Temp 97.1°F | Ht 73.0 in | Wt 361.2 lb

## 2020-11-19 DIAGNOSIS — E66813 Obesity, class 3: Secondary | ICD-10-CM

## 2020-11-19 DIAGNOSIS — R7303 Prediabetes: Secondary | ICD-10-CM

## 2020-11-19 DIAGNOSIS — Z6841 Body Mass Index (BMI) 40.0 and over, adult: Secondary | ICD-10-CM

## 2020-11-19 DIAGNOSIS — F418 Other specified anxiety disorders: Secondary | ICD-10-CM | POA: Diagnosis not present

## 2020-11-19 DIAGNOSIS — D649 Anemia, unspecified: Secondary | ICD-10-CM

## 2020-11-19 DIAGNOSIS — E559 Vitamin D deficiency, unspecified: Secondary | ICD-10-CM

## 2020-11-19 DIAGNOSIS — R0982 Postnasal drip: Secondary | ICD-10-CM

## 2020-11-19 DIAGNOSIS — R519 Headache, unspecified: Secondary | ICD-10-CM

## 2020-11-19 LAB — CBC
HCT: 39.3 % (ref 39.0–52.0)
Hemoglobin: 12.5 g/dL — ABNORMAL LOW (ref 13.0–17.0)
MCHC: 31.8 g/dL (ref 30.0–36.0)
MCV: 85.1 fl (ref 78.0–100.0)
Platelets: 305 10*3/uL (ref 150.0–400.0)
RBC: 4.62 Mil/uL (ref 4.22–5.81)
RDW: 13.5 % (ref 11.5–15.5)
WBC: 8.5 10*3/uL (ref 4.0–10.5)

## 2020-11-19 LAB — URINALYSIS, ROUTINE W REFLEX MICROSCOPIC
Bilirubin Urine: NEGATIVE
Hgb urine dipstick: NEGATIVE
Ketones, ur: NEGATIVE
Leukocytes,Ua: NEGATIVE
Nitrite: NEGATIVE
RBC / HPF: NONE SEEN (ref 0–?)
Specific Gravity, Urine: 1.02 (ref 1.000–1.030)
Total Protein, Urine: NEGATIVE
Urine Glucose: NEGATIVE
Urobilinogen, UA: 0.2 (ref 0.0–1.0)
pH: 6 (ref 5.0–8.0)

## 2020-11-19 LAB — B12 AND FOLATE PANEL
Folate: >23.4 ng/mL (ref >5.9)
Vitamin B-12: 696 pg/mL (ref 211–911)

## 2020-11-19 LAB — VITAMIN D 25 HYDROXY (VIT D DEFICIENCY, FRACTURES): VITD: 27.85 ng/mL — ABNORMAL LOW (ref 30.00–100.00)

## 2020-11-19 LAB — HEMOGLOBIN A1C: Hgb A1c MFr Bld: 4.8 % (ref 4.6–6.5)

## 2020-11-19 MED ORDER — METFORMIN HCL 500 MG PO TABS: 500.0000 mg | ORAL_TABLET | Freq: Two times a day (BID) | ORAL | 1 refills | Status: AC

## 2020-11-19 MED ORDER — SERTRALINE HCL 100 MG PO TABS: 100.0000 mg | ORAL_TABLET | Freq: Every day | ORAL | 3 refills | Status: DC

## 2020-11-19 MED ORDER — MOMETASONE FUROATE 50 MCG/ACT NA SUSP: 2.0000 | Freq: Every day | NASAL | 3 refills | Status: AC

## 2020-11-19 MED ORDER — SEMAGLUTIDE (1 MG/DOSE) 4 MG/3ML ~~LOC~~ SOPN: 1.0000 mg | PEN_INJECTOR | SUBCUTANEOUS | 3 refills | Status: DC

## 2020-11-19 NOTE — Progress Notes (Addendum)
Established Patient Office Visit  Subjective:  Patient ID: Anthony Parker, male    DOB: Oct 10, 1995  Age: 25 y.o. MRN: [id number]  CC:  Chief Complaint  Patient presents with   Tinnitus    Headaches, ears ringing symptoms x 8 months also states that he has had some episodes of deja vu that come and go x 3 months.     HPI Anthony Parker presents for follow-up of prediabetes, obesity, anxiety with depression and vitamin D deficiency.  Reports compliance with vitamin D.  He has been taking his Zoloft 50 mg nightly because it helps him sleep.  He ran out of the semaglutide a week ago.  He continues with his metformin.  Reports an 80-month history of headaches with increasing frequency.  They are located around his eyes and in the frontal area of his head.  He also tells of a generalized headache that encompasses his entire head.  Headaches have been not been so bad that he has to take something for them.  They are just increasing in frequency.  He also tells of strange sensations of deja vu and being disoriented when he awakens from sleep.  He has ongoing postnasal drip with nasal congestion.  He denies sneezing itchy watery eyes ears nose or throat or cough.  Denies fevers chills.  Spends a lot of time in front of the computer or his work.  History reviewed. No pertinent past medical history.  History reviewed. No pertinent surgical history.  Family History  Problem Relation Age of Onset   Hypertension Mother    Diabetes Mother    Hypertension Father    Diabetes Father    Hypertension Maternal Grandfather    Diabetes Maternal Grandfather    Hypertension Paternal Grandmother    Diabetes Paternal Grandmother    Hypertension Paternal Grandfather    Diabetes Paternal Grandfather    Hypertension Other    Diabetes Other     Social History   Socioeconomic History   Marital status: Single    Spouse name: Not on file   Number of children: Not on file   Years of education: Not on file    Highest education level: Not on file  Occupational History   Not on file  Tobacco Use   Smoking status: Never   Smokeless tobacco: Never  Vaping Use   Vaping Use: Never used  Substance and Sexual Activity   Alcohol use: Never   Drug use: Never   Sexual activity: Not on file  Other Topics Concern   Not on file  Social History Narrative   Not on file   Social Determinants of Health   Financial Resource Strain: Not on file  Food Insecurity: Not on file  Transportation Needs: Not on file  Physical Activity: Not on file  Stress: Not on file  Social Connections: Not on file  Intimate Partner Violence: Not on file    Outpatient Medications Prior to Visit  Medication Sig Dispense Refill   ALBUTEROL SULFATE HFA IN Inhale into the lungs.     metFORMIN (GLUCOPHAGE) 500 MG tablet Take 500 mg by mouth 2 (two) times daily.     Semaglutide, 1 MG/DOSE, 4 MG/3ML SOPN Inject 1 mg as directed once a week. 3 mL 3   sertraline (ZOLOFT) 25 MG tablet Take 1 tablet (25 mg total) by mouth daily for 7 days, THEN 2 tablets (50 mg total) daily. 67 tablet 9   Vitamin D, Ergocalciferol, (DRISDOL) 1.25 MG (50000 UNIT)  CAPS capsule Take 1 capsule (50,000 Units total) by mouth every 7 (seven) days. 20 capsule 2   metoprolol tartrate (LOPRESSOR) 25 MG tablet Take 1 tablet (25 mg total) by mouth 2 (two) times daily. (Patient not taking: Reported on 11/19/2020) 60 tablet 2   No facility-administered medications prior to visit.    No Known Allergies  ROS Review of Systems  Constitutional: Negative.  Negative for diaphoresis, fatigue, fever and unexpected weight change.  HENT:  Positive for congestion and postnasal drip. Negative for rhinorrhea, sinus pressure, sinus pain, sneezing and sore throat.   Eyes:  Negative for photophobia and visual disturbance.  Respiratory: Negative.  Negative for cough, shortness of breath and wheezing.   Cardiovascular: Negative.   Gastrointestinal: Negative.    Genitourinary: Negative.   Musculoskeletal:  Negative for gait problem and joint swelling.  Neurological:  Positive for headaches. Negative for dizziness, facial asymmetry, speech difficulty, weakness and light-headedness.  Psychiatric/Behavioral:  Positive for dysphoric mood. The patient is nervous/anxious.   Depression screen [facility] 2/9 11/19/2020 11/19/2020 11/19/2020  Decreased Interest 2 2 0  Down, Depressed, Hopeless 2 2 0  PHQ - 2 Score 4 4 0  Altered sleeping 2 2 -  Tired, decreased energy 1 1 -  Change in appetite 1 1 -  Feeling bad or failure about yourself  0 0 -  Trouble concentrating 0 0 -  Moving slowly or fidgety/restless 0 0 -  Suicidal thoughts 0 0 -  PHQ-9 Score 8 8 -  Difficult doing work/chores Somewhat difficult Not difficult at all -    GAD 7 : Generalized Anxiety Score 11/19/2020 11/19/2020 08/14/2020  Nervous, Anxious, on Edge 2 2 3   Control/stop worrying 2 2 3   Worry too much - different things 2 2 2   Trouble relaxing 1 1 2   Restless 0 0 1  Easily annoyed or irritable 0 0 0  Afraid - awful might happen 2 2 2   Total GAD 7 Score 9 9 13   Anxiety Difficulty Somewhat difficult - Extremely difficult       Objective:    Physical Exam Vitals and nursing note reviewed.  Constitutional:      General: He is not in acute distress.    Appearance: Normal appearance. He is obese. He is not ill-appearing, toxic-appearing or diaphoretic.  HENT:     Head: Normocephalic and atraumatic.     Right Ear: Tympanic membrane, ear canal and external ear normal.     Left Ear: Tympanic membrane, ear canal and external ear normal.     Mouth/Throat:     Mouth: Mucous membranes are moist.     Pharynx: Oropharynx is clear. No oropharyngeal exudate or posterior oropharyngeal erythema.  Eyes:     General: No scleral icterus.       Right eye: No discharge.        Left eye: No discharge.     Extraocular Movements: Extraocular movements intact.     Conjunctiva/sclera: Conjunctivae  normal.     Pupils: Pupils are equal, round, and reactive to light.  Neck:     Vascular: No carotid bruit.  Cardiovascular:     Rate and Rhythm: Normal rate and regular rhythm.  Pulmonary:     Effort: Pulmonary effort is normal.     Breath sounds: Normal breath sounds.  Musculoskeletal:     Cervical back: No rigidity or tenderness.  Lymphadenopathy:     Cervical: No cervical adenopathy.  Skin:    General: Skin is warm and  dry.  Neurological:     Mental Status: He is alert and oriented to person, place, and time.     Cranial Nerves: No dysarthria or facial asymmetry.  Psychiatric:        Mood and Affect: Mood normal.        Behavior: Behavior normal.    BP 124/82 (BP Location: Left Arm, Patient Position: Sitting, Cuff Size: Large)   Pulse 88   Temp (!) 97.1 F (36.2 C) (Temporal)   Ht 6\' 1"  (1.854 m)   Wt (!) 361 lb 3.2 oz (163.8 kg)   SpO2 98%   BMI 47.65 kg/m  Wt Readings from Last 3 Encounters:  11/19/20 (!) 361 lb 3.2 oz (163.8 kg)  09/18/20 (!) 361 lb (163.7 kg)  08/14/20 (!) 373 lb (169.2 kg)     Health Maintenance Due  Topic Date Due   HPV VACCINES (1 - Male 2-dose series) Never done   Hepatitis C Screening  Never done   TETANUS/TDAP  Never done       Topic Date Due   HPV VACCINES (1 - Male 2-dose series) Never done    Lab Results  Component Value Date   TSH 2.78 08/15/2020   Lab Results  Component Value Date   WBC 8.5 11/19/2020   HGB 12.5 (L) 11/19/2020   HCT 39.3 11/19/2020   MCV 85.1 11/19/2020   PLT 305.0 11/19/2020   Lab Results  Component Value Date   NA 137 08/15/2020   K 4.4 08/15/2020   CO2 26 08/15/2020   GLUCOSE 97 08/15/2020   BUN 12 08/15/2020   CREATININE 0.78 08/15/2020   BILITOT 0.5 08/15/2020   ALKPHOS 81 08/15/2020   AST 14 08/15/2020   ALT 15 08/15/2020   PROT 7.2 08/15/2020   ALBUMIN 3.9 08/15/2020   CALCIUM 9.4 08/15/2020   GFR 125.24 08/15/2020   Lab Results  Component Value Date   CHOL 173 08/15/2020    Lab Results  Component Value Date   HDL 39.60 08/15/2020   Lab Results  Component Value Date   LDLCALC 123 (H) 08/15/2020   Lab Results  Component Value Date   TRIG 50.0 08/15/2020   Lab Results  Component Value Date   CHOLHDL 4 08/15/2020   Lab Results  Component Value Date   HGBA1C 4.8 11/19/2020      Assessment & Plan:   Problem List Items Addressed This Visit       Other   Class 3 severe obesity due to excess calories without serious comorbidity with body mass index (BMI) of 45.0 to 49.9 in adult (HCC)   Relevant Medications   Semaglutide, 1 MG/DOSE, 4 MG/3ML SOPN   metFORMIN (GLUCOPHAGE) 500 MG tablet   Post-nasal drip   Relevant Medications   mometasone (NASONEX) 50 MCG/ACT nasal spray   Anxiety with depression   Relevant Medications   sertraline (ZOLOFT) 100 MG tablet   Other Relevant Orders   Ambulatory referral to Psychology   Vitamin D deficiency - Primary   Relevant Medications   Vitamin D, Ergocalciferol, (DRISDOL) 1.25 MG (50000 UNIT) CAPS capsule   Other Relevant Orders   VITAMIN D 25 Hydroxy (Vit-D Deficiency, Fractures) (Completed)   Pre-diabetes   Relevant Medications   Semaglutide, 1 MG/DOSE, 4 MG/3ML SOPN   metFORMIN (GLUCOPHAGE) 500 MG tablet   Other Relevant Orders   Hemoglobin A1c (Completed)   Urinalysis, Routine w reflex microscopic (Completed)   Nonintractable headache   Relevant Medications   sertraline (ZOLOFT)  100 MG tablet   Other Relevant Orders   CT HEAD W & WO CONTRAST ( )   Anemia   Relevant Medications   Iron, Ferrous Sulfate, 325 (65 Fe) MG TABS   Other Relevant Orders   B12 and Folate Panel (Completed)   Iron, TIBC and Ferritin Panel (Completed)   CBC (Completed)    Meds ordered this encounter  Medications   sertraline (ZOLOFT) 100 MG tablet    Sig: Take 1 tablet (100 mg total) by mouth daily.    Dispense:  30 tablet    Refill:  3   mometasone (NASONEX) 50 MCG/ACT nasal spray    Sig: Place 2 sprays into  the nose daily.    Dispense:  1 each    Refill:  3   Semaglutide, 1 MG/DOSE, 4 MG/3ML SOPN    Sig: Inject 1 mg as directed once a week.    Dispense:  3 mL    Refill:  3   metFORMIN (GLUCOPHAGE) 500 MG tablet    Sig: Take 1 tablet (500 mg total) by mouth 2 (two) times daily.    Dispense:  180 tablet    Refill:  1   Vitamin D, Ergocalciferol, (DRISDOL) 1.25 MG (50000 UNIT) CAPS capsule    Sig: Take 1 capsule (50,000 Units total) by mouth every 7 (seven) days.    Dispense:  20 capsule    Refill:  2   Iron, Ferrous Sulfate, 325 (65 Fe) MG TABS    Sig: Take 325 mg by mouth daily.    Dispense:  180 tablet    Refill:  1    Follow-up: Return in about 2 months (around 01/19/2021), or if symptoms worsen or fail to improve.  Increase Zoloft to 100 mg daily.  Recommended talking therapy.  We will start nasal steroid for postnasal drip.  He has an ophthalmology appointment scheduled early part of November.  Rechecking vitamin D level.  Spent over 45 minutes with this patient taking his history, examining and planning his care.  Mliss Sax, MD

## 2020-11-20 LAB — IRON,TIBC AND FERRITIN PANEL
%SAT: 12 % (calc) — ABNORMAL LOW (ref 20–48)
Ferritin: 111 ng/mL (ref 38–380)
Iron: 34 ug/dL — ABNORMAL LOW (ref 50–195)
TIBC: 277 mcg/dL (calc) (ref 250–425)

## 2020-11-20 MED ORDER — VITAMIN D (ERGOCALCIFEROL) 1.25 MG (50000 UNIT) PO CAPS: 50000.0000 [IU] | ORAL_CAPSULE | ORAL | 2 refills | Status: AC

## 2020-11-20 MED ORDER — IRON (FERROUS SULFATE) 325 (65 FE) MG PO TABS: 325.0000 mg | ORAL_TABLET | Freq: Every day | ORAL | 1 refills | Status: AC

## 2020-11-20 NOTE — Addendum Note (Signed)
Addended by: Andrez Grime on: 11/20/2020 12:57 PM   Modules accepted: Orders

## 2020-11-23 ENCOUNTER — Telehealth: Payer: Self-pay

## 2020-11-23 NOTE — Telephone Encounter (Signed)
PA for Nasonex submitted by faxed to [employer] through cover my meds.  Awaiting response. Dm/cma

## 2020-11-26 ENCOUNTER — Telehealth: Payer: Self-pay

## 2020-11-26 NOTE — Telephone Encounter (Signed)
Patient do for CT, scheduling calling states that patient can not be scheduled until he has updated labs for BUN, Creatine and GFR please advise

## 2020-11-27 NOTE — Telephone Encounter (Signed)
Called patient to schedule blood work, no answer LMTCB and schedule appointment.

## 2020-11-28 ENCOUNTER — Other Ambulatory Visit (INDEPENDENT_AMBULATORY_CARE_PROVIDER_SITE_OTHER): Payer: 59

## 2020-11-28 DIAGNOSIS — R7309 Other abnormal glucose: Secondary | ICD-10-CM | POA: Diagnosis not present

## 2020-11-28 LAB — BASIC METABOLIC PANEL
BUN: 9 mg/dL (ref 6–23)
CO2: 26 mEq/L (ref 19–32)
Calcium: 9.3 mg/dL (ref 8.4–10.5)
Chloride: 101 mEq/L (ref 96–112)
Creatinine, Ser: 0.82 mg/dL (ref 0.40–1.50)
GFR: 123.11 mL/min (ref >60.00)
Glucose, Bld: 170 mg/dL — ABNORMAL HIGH (ref 70–99)
Potassium: 4.1 mEq/L (ref 3.5–5.1)
Sodium: 135 mEq/L (ref 135–145)

## 2020-11-28 NOTE — Progress Notes (Signed)
Pt is here for blood work. Blood draw done on left median cubital vein. Pt tolerated draw well.

## 2020-11-28 NOTE — Telephone Encounter (Signed)
Appointment scheduled.

## 2020-12-05 ENCOUNTER — Other Ambulatory Visit: Payer: Self-pay | Admitting: Family Medicine

## 2020-12-05 DIAGNOSIS — R030 Elevated blood-pressure reading, without diagnosis of hypertension: Secondary | ICD-10-CM

## 2020-12-05 DIAGNOSIS — F418 Other specified anxiety disorders: Secondary | ICD-10-CM

## 2020-12-20 ENCOUNTER — Ambulatory Visit: Payer: 59 | Admitting: Family Medicine

## 2021-01-12 ENCOUNTER — Encounter: Payer: Self-pay | Admitting: Family Medicine

## 2021-01-21 ENCOUNTER — Ambulatory Visit: Payer: 59 | Admitting: Family Medicine

## 2021-02-07 ENCOUNTER — Encounter: Payer: Self-pay | Admitting: Family Medicine

## 2021-02-07 DIAGNOSIS — R7309 Other abnormal glucose: Secondary | ICD-10-CM

## 2021-02-07 MED ORDER — SEMAGLUTIDE (2 MG/DOSE) 8 MG/3ML ~~LOC~~ SOPN: 2.0000 mg | PEN_INJECTOR | SUBCUTANEOUS | 1 refills | Status: AC

## 2021-05-03 ENCOUNTER — Other Ambulatory Visit: Payer: Self-pay

## 2021-05-03 DIAGNOSIS — F418 Other specified anxiety disorders: Secondary | ICD-10-CM

## 2021-05-03 MED ORDER — SERTRALINE HCL 100 MG PO TABS: 100.0000 mg | ORAL_TABLET | Freq: Every day | ORAL | 3 refills | Status: AC

## 2021-05-03 NOTE — Telephone Encounter (Signed)
Refill request for pending Rx last OV and refill 11/19/20. Please advise.  ?

## 2021-05-06 NOTE — Telephone Encounter (Signed)
Called patient to schedule follow up appointment, no answer LMTCB also sent message via mychart to call and schedule.  ?

## 2021-10-29 IMAGING — US US PELVIS LIMITED
1 series · 14 of 16 positions shown · non-contrast
Comparison: CT scan August 09, 2018

CLINICAL DATA: Ultrasound of the right groin. Palpable area for 7
months.

EXAM:
US PELVIS LIMITED
TECHNIQUE: Grayscale and color Doppler imaging was obtained.

[Series 1: us pelvis limited · 0.07mm/px · 16 acquisitions, 14 frames shown]
[im 1/16]
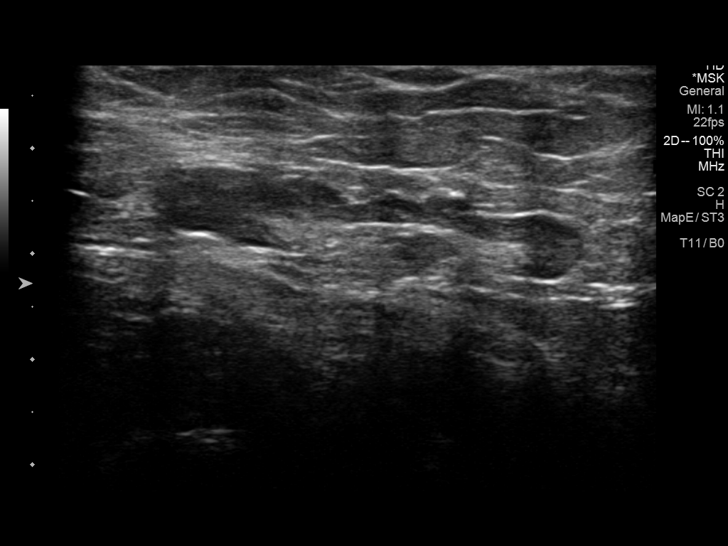
[im 2/16]
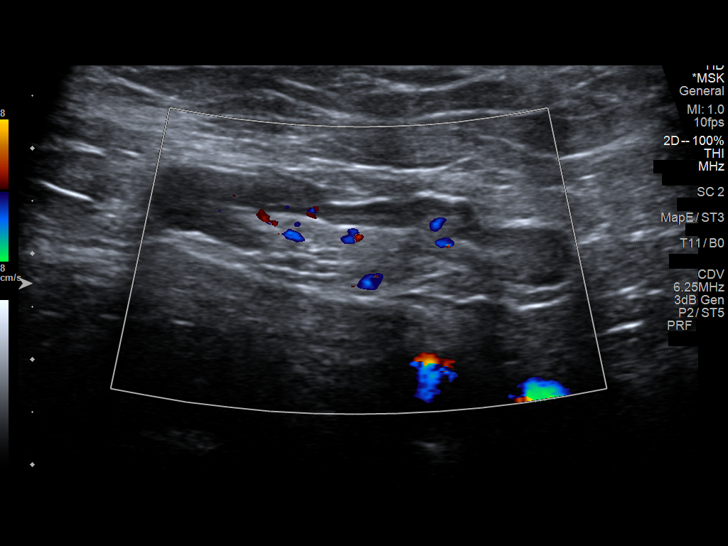
[im 3/16]
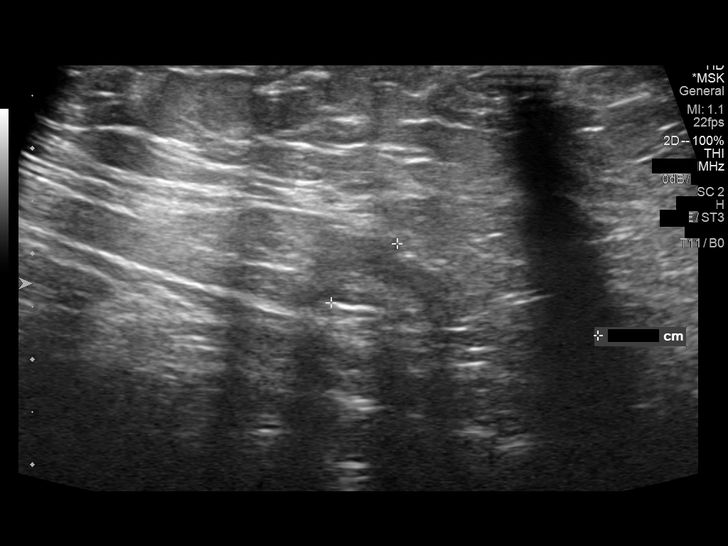
[im 5/16]
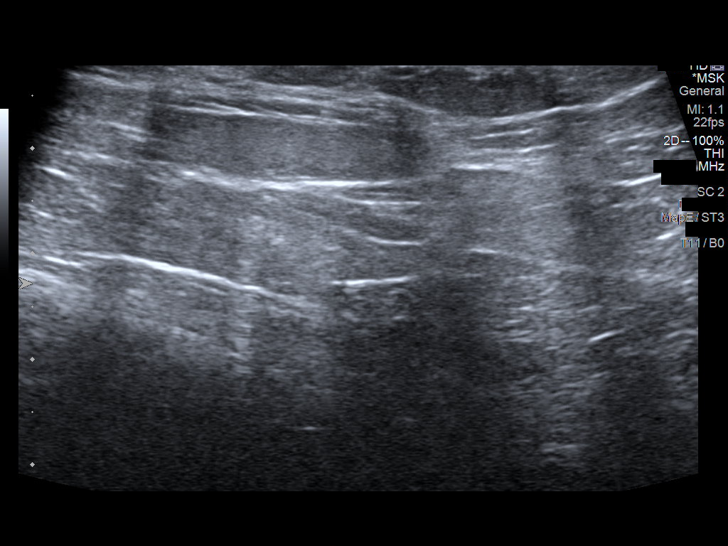
[im 6/16]
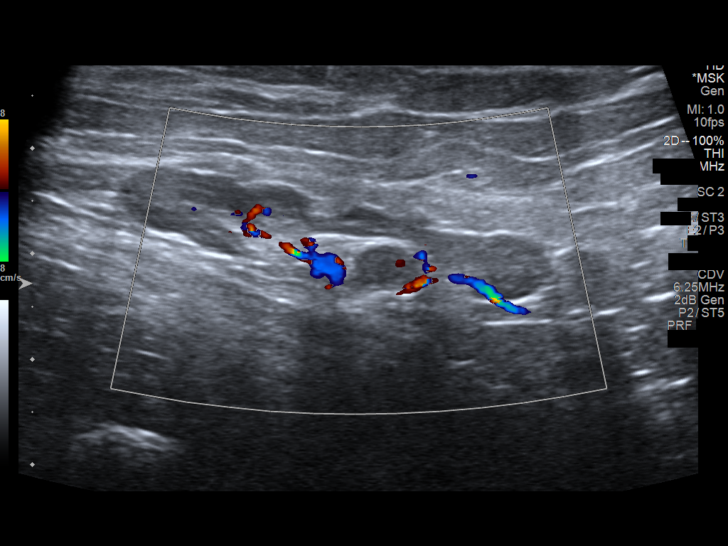
[im 7/16]
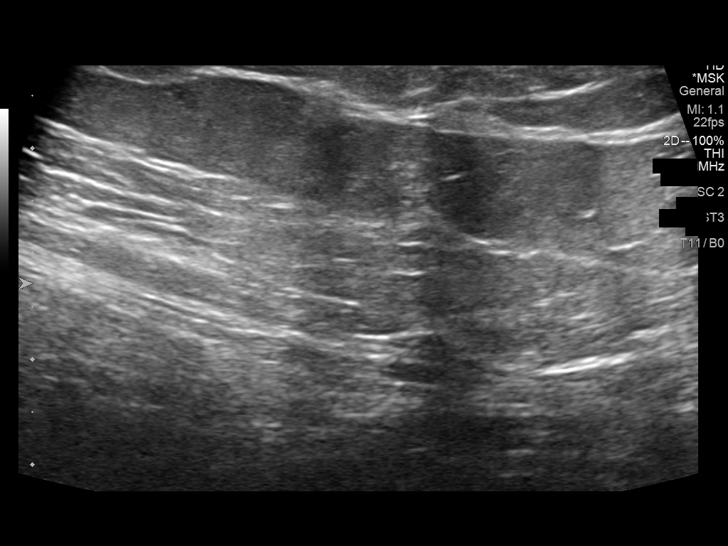
[im 8/16]
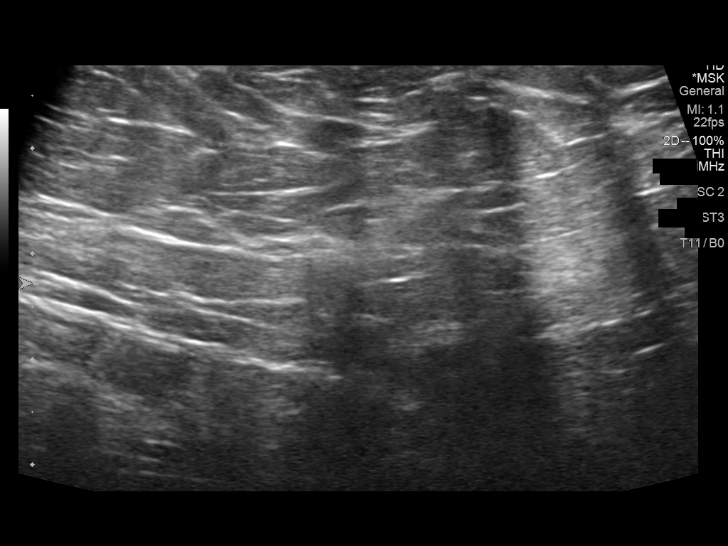
[im 9/16]
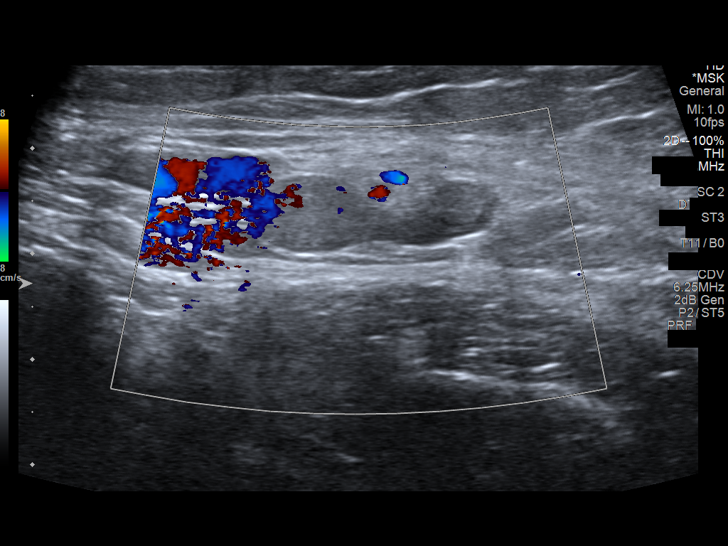
[im 10/16]
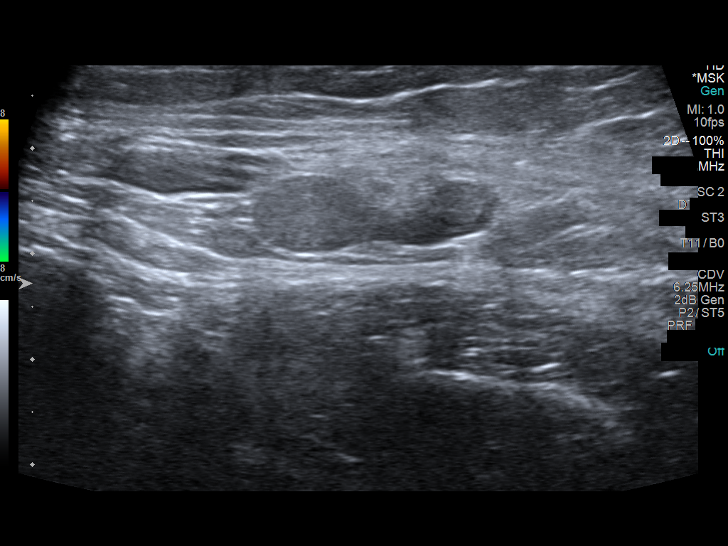
[im 11/16]
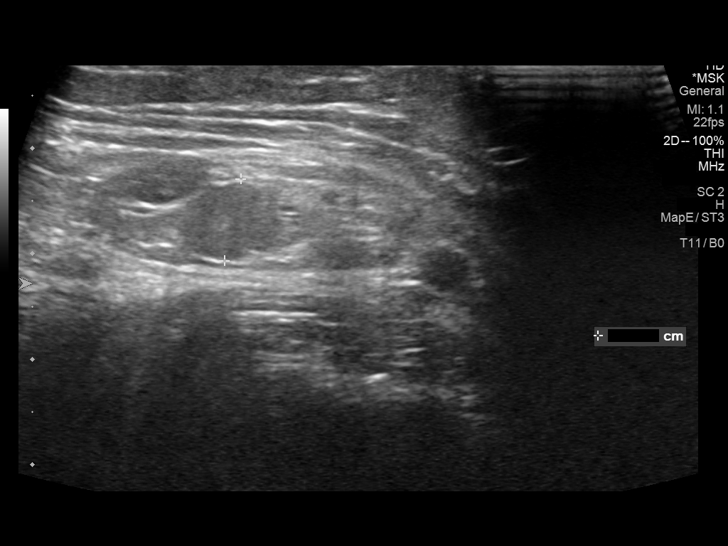
[im 13/16]
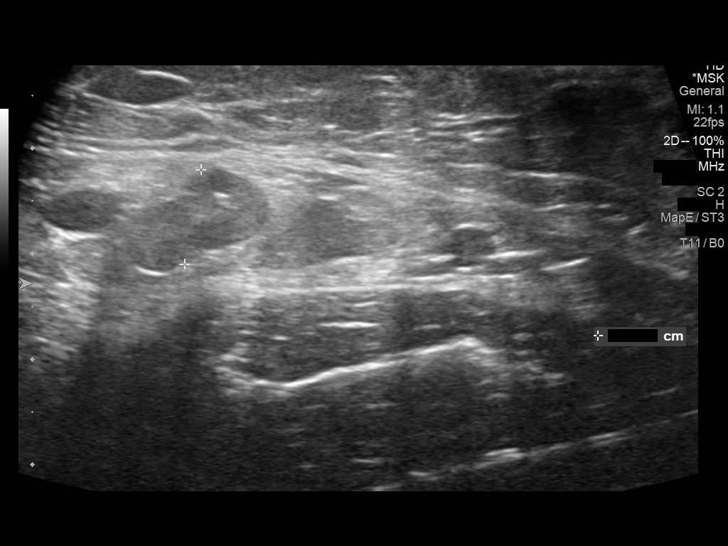
[im 14/16]
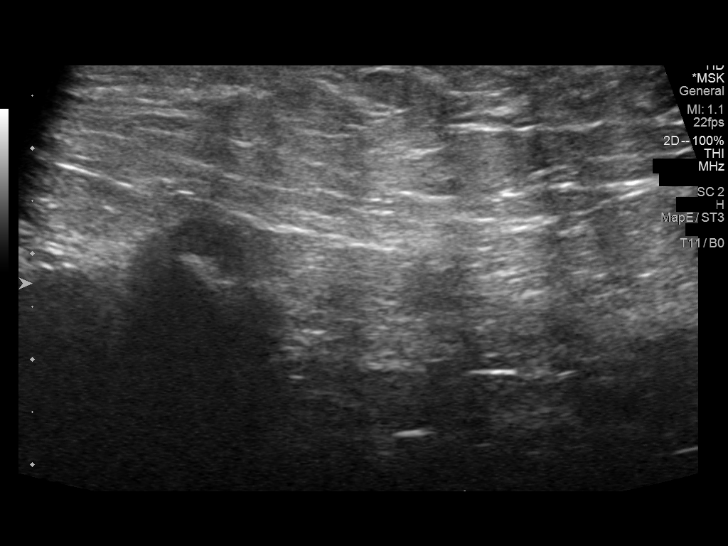
[im 15/16]
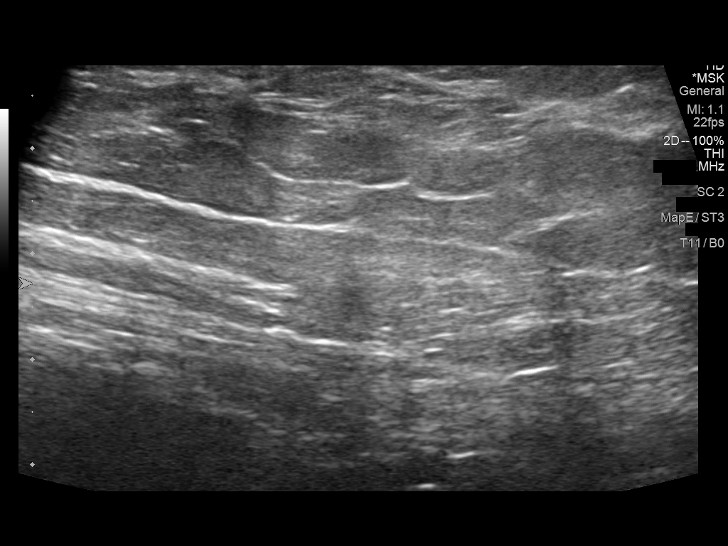
[im 16/16]
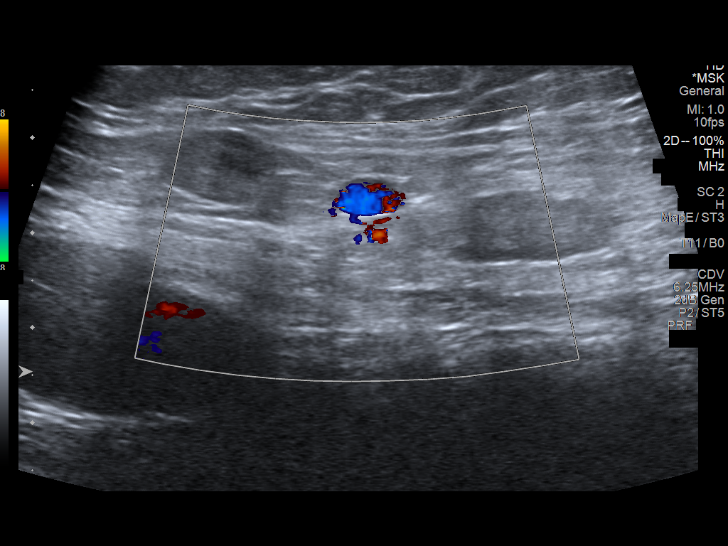

[14 of 16 positions shown; findings below may reference images not displayed]

FINDINGS: Multiple lymph nodes are seen in the region of the patient's
palpable lumps. The palpated node demonstrates a fatty hilum and a
thin outer cortex. Several other similar appearing nodes are
identified, some of which are mildly prominent. No other
abnormalities.
IMPRESSION: The patient is feeling mildly prominent nodes in the right inguinal
region. I suspect these nodes may be normal for this patient or
reactive. Recommend close clinical follow-up. A follow-up ultrasound
in 3 months could ensure stability and or decrease in size.
